# Patient Record
Sex: Female | Born: 1955
Health system: Southern US, Community
[De-identification: ages and names within clinical notes are randomized; demographics above are authoritative.]

---

## 2014-05-16 DIAGNOSIS — R0789 Other chest pain: Secondary | ICD-10-CM | POA: Diagnosis not present

## 2014-05-16 DIAGNOSIS — F17210 Nicotine dependence, cigarettes, uncomplicated: Secondary | ICD-10-CM | POA: Diagnosis not present

## 2014-05-16 DIAGNOSIS — J45909 Unspecified asthma, uncomplicated: Secondary | ICD-10-CM | POA: Diagnosis not present

## 2014-05-16 DIAGNOSIS — R0602 Shortness of breath: Secondary | ICD-10-CM | POA: Diagnosis not present

## 2014-05-16 DIAGNOSIS — I10 Essential (primary) hypertension: Secondary | ICD-10-CM | POA: Diagnosis not present

## 2014-05-16 DIAGNOSIS — J449 Chronic obstructive pulmonary disease, unspecified: Secondary | ICD-10-CM | POA: Diagnosis not present

## 2014-05-16 DIAGNOSIS — I2510 Atherosclerotic heart disease of native coronary artery without angina pectoris: Secondary | ICD-10-CM | POA: Diagnosis not present

## 2014-05-16 DIAGNOSIS — R069 Unspecified abnormalities of breathing: Secondary | ICD-10-CM | POA: Diagnosis not present

## 2014-05-16 DIAGNOSIS — E780 Pure hypercholesterolemia: Secondary | ICD-10-CM | POA: Diagnosis not present

## 2014-05-16 DIAGNOSIS — Z7982 Long term (current) use of aspirin: Secondary | ICD-10-CM | POA: Diagnosis not present

## 2014-05-16 DIAGNOSIS — J441 Chronic obstructive pulmonary disease with (acute) exacerbation: Secondary | ICD-10-CM | POA: Diagnosis not present

## 2014-05-16 DIAGNOSIS — E039 Hypothyroidism, unspecified: Secondary | ICD-10-CM | POA: Diagnosis not present

## 2014-05-20 DIAGNOSIS — J449 Chronic obstructive pulmonary disease, unspecified: Secondary | ICD-10-CM | POA: Diagnosis not present

## 2014-05-29 DIAGNOSIS — M25562 Pain in left knee: Secondary | ICD-10-CM | POA: Diagnosis not present

## 2014-06-12 DIAGNOSIS — M1712 Unilateral primary osteoarthritis, left knee: Secondary | ICD-10-CM | POA: Diagnosis not present

## 2014-06-20 DIAGNOSIS — J449 Chronic obstructive pulmonary disease, unspecified: Secondary | ICD-10-CM | POA: Diagnosis not present

## 2014-07-19 DIAGNOSIS — J449 Chronic obstructive pulmonary disease, unspecified: Secondary | ICD-10-CM | POA: Diagnosis not present

## 2014-07-25 DIAGNOSIS — B86 Scabies: Secondary | ICD-10-CM | POA: Diagnosis not present

## 2014-07-25 DIAGNOSIS — B372 Candidiasis of skin and nail: Secondary | ICD-10-CM | POA: Diagnosis not present

## 2014-08-19 DIAGNOSIS — J449 Chronic obstructive pulmonary disease, unspecified: Secondary | ICD-10-CM | POA: Diagnosis not present

## 2014-08-27 DIAGNOSIS — G4733 Obstructive sleep apnea (adult) (pediatric): Secondary | ICD-10-CM | POA: Diagnosis not present

## 2014-09-04 DIAGNOSIS — G4733 Obstructive sleep apnea (adult) (pediatric): Secondary | ICD-10-CM | POA: Diagnosis not present

## 2014-09-18 DIAGNOSIS — J449 Chronic obstructive pulmonary disease, unspecified: Secondary | ICD-10-CM | POA: Diagnosis not present

## 2014-10-19 DIAGNOSIS — J449 Chronic obstructive pulmonary disease, unspecified: Secondary | ICD-10-CM | POA: Diagnosis not present

## 2014-11-18 DIAGNOSIS — J449 Chronic obstructive pulmonary disease, unspecified: Secondary | ICD-10-CM | POA: Diagnosis not present

## 2014-12-16 DIAGNOSIS — R7309 Other abnormal glucose: Secondary | ICD-10-CM | POA: Diagnosis not present

## 2014-12-16 DIAGNOSIS — J449 Chronic obstructive pulmonary disease, unspecified: Secondary | ICD-10-CM | POA: Diagnosis not present

## 2014-12-16 DIAGNOSIS — E079 Disorder of thyroid, unspecified: Secondary | ICD-10-CM | POA: Diagnosis not present

## 2014-12-16 DIAGNOSIS — E780 Pure hypercholesterolemia: Secondary | ICD-10-CM | POA: Diagnosis not present

## 2014-12-16 DIAGNOSIS — I10 Essential (primary) hypertension: Secondary | ICD-10-CM | POA: Diagnosis not present

## 2014-12-19 DIAGNOSIS — J449 Chronic obstructive pulmonary disease, unspecified: Secondary | ICD-10-CM | POA: Diagnosis not present

## 2015-01-19 DIAGNOSIS — J449 Chronic obstructive pulmonary disease, unspecified: Secondary | ICD-10-CM | POA: Diagnosis not present

## 2015-02-18 DIAGNOSIS — J449 Chronic obstructive pulmonary disease, unspecified: Secondary | ICD-10-CM | POA: Diagnosis not present

## 2015-02-24 DIAGNOSIS — R0602 Shortness of breath: Secondary | ICD-10-CM | POA: Diagnosis not present

## 2015-02-24 DIAGNOSIS — Z9981 Dependence on supplemental oxygen: Secondary | ICD-10-CM | POA: Diagnosis not present

## 2015-02-24 DIAGNOSIS — K219 Gastro-esophageal reflux disease without esophagitis: Secondary | ICD-10-CM | POA: Diagnosis not present

## 2015-02-24 DIAGNOSIS — J441 Chronic obstructive pulmonary disease with (acute) exacerbation: Secondary | ICD-10-CM | POA: Diagnosis not present

## 2015-02-24 DIAGNOSIS — E7800 Pure hypercholesterolemia, unspecified: Secondary | ICD-10-CM | POA: Diagnosis not present

## 2015-02-24 DIAGNOSIS — I10 Essential (primary) hypertension: Secondary | ICD-10-CM | POA: Diagnosis not present

## 2015-02-24 DIAGNOSIS — I2510 Atherosclerotic heart disease of native coronary artery without angina pectoris: Secondary | ICD-10-CM | POA: Diagnosis not present

## 2015-02-24 DIAGNOSIS — R0600 Dyspnea, unspecified: Secondary | ICD-10-CM | POA: Diagnosis not present

## 2015-02-24 DIAGNOSIS — R079 Chest pain, unspecified: Secondary | ICD-10-CM | POA: Diagnosis not present

## 2015-02-24 DIAGNOSIS — F17200 Nicotine dependence, unspecified, uncomplicated: Secondary | ICD-10-CM | POA: Diagnosis not present

## 2015-02-24 DIAGNOSIS — Z79899 Other long term (current) drug therapy: Secondary | ICD-10-CM | POA: Diagnosis not present

## 2015-02-24 DIAGNOSIS — R072 Precordial pain: Secondary | ICD-10-CM | POA: Diagnosis not present

## 2015-02-24 DIAGNOSIS — E039 Hypothyroidism, unspecified: Secondary | ICD-10-CM | POA: Diagnosis not present

## 2015-02-25 DIAGNOSIS — R0600 Dyspnea, unspecified: Secondary | ICD-10-CM | POA: Diagnosis not present

## 2015-02-25 DIAGNOSIS — R079 Chest pain, unspecified: Secondary | ICD-10-CM | POA: Diagnosis not present

## 2015-03-21 DIAGNOSIS — J449 Chronic obstructive pulmonary disease, unspecified: Secondary | ICD-10-CM | POA: Diagnosis not present

## 2015-04-20 DIAGNOSIS — J449 Chronic obstructive pulmonary disease, unspecified: Secondary | ICD-10-CM | POA: Diagnosis not present

## 2015-05-21 DIAGNOSIS — J449 Chronic obstructive pulmonary disease, unspecified: Secondary | ICD-10-CM | POA: Diagnosis not present

## 2015-06-21 DIAGNOSIS — J449 Chronic obstructive pulmonary disease, unspecified: Secondary | ICD-10-CM | POA: Diagnosis not present

## 2015-07-19 DIAGNOSIS — J449 Chronic obstructive pulmonary disease, unspecified: Secondary | ICD-10-CM | POA: Diagnosis not present

## 2015-08-04 DIAGNOSIS — Z0000 Encounter for general adult medical examination without abnormal findings: Secondary | ICD-10-CM | POA: Diagnosis not present

## 2015-08-04 DIAGNOSIS — R1013 Epigastric pain: Secondary | ICD-10-CM | POA: Diagnosis not present

## 2015-08-04 DIAGNOSIS — Z23 Encounter for immunization: Secondary | ICD-10-CM | POA: Diagnosis not present

## 2015-08-04 DIAGNOSIS — G8929 Other chronic pain: Secondary | ICD-10-CM | POA: Diagnosis not present

## 2015-08-06 DIAGNOSIS — R1010 Upper abdominal pain, unspecified: Secondary | ICD-10-CM | POA: Diagnosis not present

## 2015-08-19 DIAGNOSIS — J449 Chronic obstructive pulmonary disease, unspecified: Secondary | ICD-10-CM | POA: Diagnosis not present

## 2015-09-18 DIAGNOSIS — J449 Chronic obstructive pulmonary disease, unspecified: Secondary | ICD-10-CM | POA: Diagnosis not present

## 2015-10-19 DIAGNOSIS — J310 Chronic rhinitis: Secondary | ICD-10-CM | POA: Diagnosis not present

## 2015-10-19 DIAGNOSIS — R5383 Other fatigue: Secondary | ICD-10-CM | POA: Diagnosis not present

## 2015-10-19 DIAGNOSIS — J449 Chronic obstructive pulmonary disease, unspecified: Secondary | ICD-10-CM | POA: Diagnosis not present

## 2015-10-19 DIAGNOSIS — G4733 Obstructive sleep apnea (adult) (pediatric): Secondary | ICD-10-CM | POA: Diagnosis not present

## 2015-10-21 DIAGNOSIS — G4733 Obstructive sleep apnea (adult) (pediatric): Secondary | ICD-10-CM | POA: Diagnosis not present

## 2015-12-07 DIAGNOSIS — R21 Rash and other nonspecific skin eruption: Secondary | ICD-10-CM | POA: Diagnosis not present

## 2016-04-18 DIAGNOSIS — J310 Chronic rhinitis: Secondary | ICD-10-CM | POA: Diagnosis not present

## 2016-04-18 DIAGNOSIS — R5383 Other fatigue: Secondary | ICD-10-CM | POA: Diagnosis not present

## 2016-04-18 DIAGNOSIS — J449 Chronic obstructive pulmonary disease, unspecified: Secondary | ICD-10-CM | POA: Diagnosis not present

## 2016-04-18 DIAGNOSIS — G4733 Obstructive sleep apnea (adult) (pediatric): Secondary | ICD-10-CM | POA: Diagnosis not present

## 2016-04-29 DIAGNOSIS — J329 Chronic sinusitis, unspecified: Secondary | ICD-10-CM | POA: Diagnosis not present

## 2016-04-29 DIAGNOSIS — R233 Spontaneous ecchymoses: Secondary | ICD-10-CM | POA: Diagnosis not present

## 2016-04-29 DIAGNOSIS — R109 Unspecified abdominal pain: Secondary | ICD-10-CM | POA: Diagnosis not present

## 2016-04-29 DIAGNOSIS — R110 Nausea: Secondary | ICD-10-CM | POA: Diagnosis not present

## 2016-05-05 DIAGNOSIS — N200 Calculus of kidney: Secondary | ICD-10-CM | POA: Diagnosis not present

## 2016-05-05 DIAGNOSIS — R1012 Left upper quadrant pain: Secondary | ICD-10-CM | POA: Diagnosis not present

## 2016-05-11 ENCOUNTER — Inpatient Hospital Stay (HOSPITAL_COMMUNITY)
Admission: EM | Admit: 2016-05-11 | Discharge: 2016-05-16 | DRG: 064 | Disposition: E | Payer: Medicare Other | Attending: Pulmonary Disease | Admitting: Pulmonary Disease

## 2016-05-11 ENCOUNTER — Inpatient Hospital Stay (HOSPITAL_COMMUNITY): Payer: Medicare Other

## 2016-05-11 ENCOUNTER — Emergency Department (HOSPITAL_COMMUNITY): Payer: Medicare Other

## 2016-05-11 DIAGNOSIS — R739 Hyperglycemia, unspecified: Secondary | ICD-10-CM | POA: Diagnosis not present

## 2016-05-11 DIAGNOSIS — Z9981 Dependence on supplemental oxygen: Secondary | ICD-10-CM | POA: Diagnosis not present

## 2016-05-11 DIAGNOSIS — J439 Emphysema, unspecified: Secondary | ICD-10-CM | POA: Diagnosis not present

## 2016-05-11 DIAGNOSIS — Z4682 Encounter for fitting and adjustment of non-vascular catheter: Secondary | ICD-10-CM | POA: Diagnosis not present

## 2016-05-11 DIAGNOSIS — R4701 Aphasia: Secondary | ICD-10-CM | POA: Diagnosis not present

## 2016-05-11 DIAGNOSIS — G8191 Hemiplegia, unspecified affecting right dominant side: Secondary | ICD-10-CM | POA: Diagnosis present

## 2016-05-11 DIAGNOSIS — R1310 Dysphagia, unspecified: Secondary | ICD-10-CM | POA: Diagnosis present

## 2016-05-11 DIAGNOSIS — Z515 Encounter for palliative care: Secondary | ICD-10-CM | POA: Diagnosis not present

## 2016-05-11 DIAGNOSIS — I10 Essential (primary) hypertension: Secondary | ICD-10-CM | POA: Diagnosis present

## 2016-05-11 DIAGNOSIS — W19XXXA Unspecified fall, initial encounter: Secondary | ICD-10-CM

## 2016-05-11 DIAGNOSIS — Z6838 Body mass index (BMI) 38.0-38.9, adult: Secondary | ICD-10-CM

## 2016-05-11 DIAGNOSIS — E669 Obesity, unspecified: Secondary | ICD-10-CM | POA: Diagnosis present

## 2016-05-11 DIAGNOSIS — R400 Somnolence: Secondary | ICD-10-CM | POA: Diagnosis not present

## 2016-05-11 DIAGNOSIS — R34 Anuria and oliguria: Secondary | ICD-10-CM | POA: Diagnosis not present

## 2016-05-11 DIAGNOSIS — Z66 Do not resuscitate: Secondary | ICD-10-CM | POA: Diagnosis not present

## 2016-05-11 DIAGNOSIS — G936 Cerebral edema: Secondary | ICD-10-CM | POA: Diagnosis not present

## 2016-05-11 DIAGNOSIS — J9600 Acute respiratory failure, unspecified whether with hypoxia or hypercapnia: Secondary | ICD-10-CM | POA: Diagnosis present

## 2016-05-11 DIAGNOSIS — Z7983 Long term (current) use of bisphosphonates: Secondary | ICD-10-CM

## 2016-05-11 DIAGNOSIS — R4020 Unspecified coma: Secondary | ICD-10-CM | POA: Diagnosis not present

## 2016-05-11 DIAGNOSIS — R29722 NIHSS score 22: Secondary | ICD-10-CM | POA: Diagnosis not present

## 2016-05-11 DIAGNOSIS — Z978 Presence of other specified devices: Secondary | ICD-10-CM

## 2016-05-11 DIAGNOSIS — R402421 Glasgow coma scale score 9-12, in the field [EMT or ambulance]: Secondary | ICD-10-CM | POA: Diagnosis not present

## 2016-05-11 DIAGNOSIS — E870 Hyperosmolality and hypernatremia: Secondary | ICD-10-CM | POA: Diagnosis present

## 2016-05-11 DIAGNOSIS — J449 Chronic obstructive pulmonary disease, unspecified: Secondary | ICD-10-CM | POA: Diagnosis present

## 2016-05-11 DIAGNOSIS — Z79899 Other long term (current) drug therapy: Secondary | ICD-10-CM

## 2016-05-11 DIAGNOSIS — I6789 Other cerebrovascular disease: Secondary | ICD-10-CM | POA: Diagnosis not present

## 2016-05-11 DIAGNOSIS — J9601 Acute respiratory failure with hypoxia: Secondary | ICD-10-CM | POA: Diagnosis not present

## 2016-05-11 DIAGNOSIS — Z9289 Personal history of other medical treatment: Secondary | ICD-10-CM

## 2016-05-11 DIAGNOSIS — I63412 Cerebral infarction due to embolism of left middle cerebral artery: Principal | ICD-10-CM | POA: Diagnosis present

## 2016-05-11 DIAGNOSIS — I639 Cerebral infarction, unspecified: Secondary | ICD-10-CM | POA: Diagnosis not present

## 2016-05-11 DIAGNOSIS — Z452 Encounter for adjustment and management of vascular access device: Secondary | ICD-10-CM

## 2016-05-11 DIAGNOSIS — E785 Hyperlipidemia, unspecified: Secondary | ICD-10-CM | POA: Diagnosis present

## 2016-05-11 DIAGNOSIS — J9811 Atelectasis: Secondary | ICD-10-CM | POA: Diagnosis not present

## 2016-05-11 DIAGNOSIS — Z4659 Encounter for fitting and adjustment of other gastrointestinal appliance and device: Secondary | ICD-10-CM

## 2016-05-11 DIAGNOSIS — S40011A Contusion of right shoulder, initial encounter: Secondary | ICD-10-CM | POA: Diagnosis not present

## 2016-05-11 DIAGNOSIS — G9340 Encephalopathy, unspecified: Secondary | ICD-10-CM | POA: Diagnosis not present

## 2016-05-11 DIAGNOSIS — R4182 Altered mental status, unspecified: Secondary | ICD-10-CM | POA: Diagnosis present

## 2016-05-11 DIAGNOSIS — I161 Hypertensive emergency: Secondary | ICD-10-CM | POA: Diagnosis not present

## 2016-05-11 DIAGNOSIS — I63512 Cerebral infarction due to unspecified occlusion or stenosis of left middle cerebral artery: Secondary | ICD-10-CM

## 2016-05-11 LAB — I-STAT TROPONIN, ED: TROPONIN I, POC: 0 ng/mL (ref 0.00–0.08)

## 2016-05-11 LAB — I-STAT CHEM 8, ED
BUN: 15 mg/dL (ref 6–20)
Calcium, Ion: 1.05 mmol/L — ABNORMAL LOW (ref 1.15–1.40)
Chloride: 101 mmol/L (ref 101–111)
Creatinine, Ser: 0.5 mg/dL (ref 0.44–1.00)
Glucose, Bld: 160 mg/dL — ABNORMAL HIGH (ref 65–99)
HEMATOCRIT: 48 % — AB (ref 36.0–46.0)
HEMOGLOBIN: 16.3 g/dL — AB (ref 12.0–15.0)
POTASSIUM: 4 mmol/L (ref 3.5–5.1)
SODIUM: 140 mmol/L (ref 135–145)
TCO2: 27 mmol/L (ref 0–100)

## 2016-05-11 LAB — I-STAT CG4 LACTIC ACID, ED: Lactic Acid, Venous: 3.16 mmol/L (ref 0.5–1.9)

## 2016-05-11 LAB — URINALYSIS, ROUTINE W REFLEX MICROSCOPIC
BACTERIA UA: NONE SEEN
Bilirubin Urine: NEGATIVE
Glucose, UA: >=500 mg/dL — AB
Hgb urine dipstick: NEGATIVE
Ketones, ur: NEGATIVE mg/dL
Leukocytes, UA: NEGATIVE
Nitrite: NEGATIVE
PROTEIN: NEGATIVE mg/dL
SPECIFIC GRAVITY, URINE: 1.021 (ref 1.005–1.030)
pH: 6 (ref 5.0–8.0)

## 2016-05-11 LAB — COMPREHENSIVE METABOLIC PANEL
ALBUMIN: 3.5 g/dL (ref 3.5–5.0)
ALK PHOS: 67 U/L (ref 38–126)
ALT: 28 U/L (ref 14–54)
ANION GAP: 13 (ref 5–15)
AST: 38 U/L (ref 15–41)
BILIRUBIN TOTAL: 0.6 mg/dL (ref 0.3–1.2)
BUN: 13 mg/dL (ref 6–20)
CO2: 24 mmol/L (ref 22–32)
Calcium: 9 mg/dL (ref 8.9–10.3)
Chloride: 101 mmol/L (ref 101–111)
Creatinine, Ser: 0.63 mg/dL (ref 0.44–1.00)
GLUCOSE: 151 mg/dL — AB (ref 65–99)
POTASSIUM: 4 mmol/L (ref 3.5–5.1)
Sodium: 138 mmol/L (ref 135–145)
TOTAL PROTEIN: 7.2 g/dL (ref 6.5–8.1)

## 2016-05-11 LAB — CBC WITH DIFFERENTIAL/PLATELET
BASOS PCT: 0 %
Basophils Absolute: 0 10*3/uL (ref 0.0–0.1)
Eosinophils Absolute: 0 10*3/uL (ref 0.0–0.7)
Eosinophils Relative: 0 %
HEMATOCRIT: 45.2 % (ref 36.0–46.0)
HEMOGLOBIN: 15.4 g/dL — AB (ref 12.0–15.0)
LYMPHS PCT: 11 %
Lymphs Abs: 1.4 10*3/uL (ref 0.7–4.0)
MCH: 31.3 pg (ref 26.0–34.0)
MCHC: 34.1 g/dL (ref 30.0–36.0)
MCV: 91.9 fL (ref 78.0–100.0)
MONO ABS: 0.9 10*3/uL (ref 0.1–1.0)
MONOS PCT: 7 %
NEUTROS ABS: 10.9 10*3/uL — AB (ref 1.7–7.7)
NEUTROS PCT: 82 %
Platelets: 249 10*3/uL (ref 150–400)
RBC: 4.92 MIL/uL (ref 3.87–5.11)
RDW: 12.3 % (ref 11.5–15.5)
WBC: 13.1 10*3/uL — ABNORMAL HIGH (ref 4.0–10.5)

## 2016-05-11 LAB — PROTIME-INR
INR: 1.03
PROTHROMBIN TIME: 13.5 s (ref 11.4–15.2)

## 2016-05-11 LAB — PHOSPHORUS: Phosphorus: 3.9 mg/dL (ref 2.5–4.6)

## 2016-05-11 LAB — SODIUM: SODIUM: 139 mmol/L (ref 135–145)

## 2016-05-11 LAB — I-STAT ARTERIAL BLOOD GAS, ED
ACID-BASE EXCESS: 4 mmol/L — AB (ref 0.0–2.0)
BICARBONATE: 31.6 mmol/L — AB (ref 20.0–28.0)
O2 Saturation: 100 %
PH ART: 7.327 — AB (ref 7.350–7.450)
PO2 ART: 479 mmHg — AB (ref 83.0–108.0)
TCO2: 33 mmol/L (ref 0–100)
pCO2 arterial: 60.3 mmHg — ABNORMAL HIGH (ref 32.0–48.0)

## 2016-05-11 LAB — TRIGLYCERIDES
Triglycerides: 106 mg/dL (ref <150)
Triglycerides: 149 mg/dL (ref <150)

## 2016-05-11 LAB — ACETAMINOPHEN LEVEL

## 2016-05-11 LAB — SALICYLATE LEVEL

## 2016-05-11 LAB — MAGNESIUM: MAGNESIUM: 1.9 mg/dL (ref 1.7–2.4)

## 2016-05-11 LAB — CK: Total CK: 198 U/L (ref 38–234)

## 2016-05-11 LAB — LACTIC ACID, PLASMA: LACTIC ACID, VENOUS: 2.7 mmol/L — AB (ref 0.5–1.9)

## 2016-05-11 LAB — ETHANOL: Alcohol, Ethyl (B): <5 mg/dL (ref <5)

## 2016-05-11 MED ORDER — ROCURONIUM BROMIDE 50 MG/5ML IV SOLN
90.0000 mg | Freq: Once | INTRAVENOUS | Status: AC
  Filled 2016-05-11: qty 9

## 2016-05-11 MED ORDER — FAMOTIDINE IN NACL 20-0.9 MG/50ML-% IV SOLN
20.0000 mg | Freq: Two times a day (BID) | INTRAVENOUS | Status: DC
  Administered 2016-05-11 – 2016-05-12 (×3): 20 mg via INTRAVENOUS
  Filled 2016-05-11 (×3): qty 50

## 2016-05-11 MED ORDER — ALBUTEROL SULFATE (2.5 MG/3ML) 0.083% IN NEBU: 2.5000 mg | INHALATION_SOLUTION | RESPIRATORY_TRACT | Status: DC | PRN

## 2016-05-11 MED ORDER — FENTANYL CITRATE (PF) 100 MCG/2ML IJ SOLN
50.0000 ug | Freq: Once | INTRAMUSCULAR | Status: AC
  Administered 2016-05-11: 50 ug via INTRAVENOUS
  Filled 2016-05-11: qty 2

## 2016-05-11 MED ORDER — ETOMIDATE 2 MG/ML IV SOLN: 30.0000 mg | Freq: Once | INTRAVENOUS | Status: AC

## 2016-05-11 MED ORDER — SODIUM CHLORIDE 0.9 % IV SOLN
25.0000 ug/h | INTRAVENOUS | Status: DC
  Administered 2016-05-11: 50 ug/h via INTRAVENOUS
  Filled 2016-05-11: qty 50

## 2016-05-11 MED ORDER — IPRATROPIUM-ALBUTEROL 0.5-2.5 (3) MG/3ML IN SOLN: 3.0000 mL | Freq: Four times a day (QID) | RESPIRATORY_TRACT | Status: DC

## 2016-05-11 MED ORDER — LEVOTHYROXINE SODIUM 100 MCG IV SOLR
37.5000 ug | Freq: Every day | INTRAVENOUS | Status: DC
  Administered 2016-05-12: 37.5 ug via INTRAVENOUS
  Filled 2016-05-11: qty 5

## 2016-05-11 MED ORDER — CHLORHEXIDINE GLUCONATE 0.12% ORAL RINSE (MEDLINE KIT)
15.0000 mL | Freq: Two times a day (BID) | OROMUCOSAL | Status: DC
  Administered 2016-05-11 – 2016-05-13 (×4): 15 mL via OROMUCOSAL

## 2016-05-11 MED ORDER — ROCURONIUM BROMIDE 50 MG/5ML IV SOLN
INTRAVENOUS | Status: AC | PRN
  Administered 2016-05-11: 90 mg via INTRAVENOUS

## 2016-05-11 MED ORDER — PROPOFOL 1000 MG/100ML IV EMUL
0.0000 ug/kg/min | INTRAVENOUS | Status: DC
  Administered 2016-05-11: 5 ug/kg/min via INTRAVENOUS
  Filled 2016-05-11: qty 100

## 2016-05-11 MED ORDER — ORAL CARE MOUTH RINSE
15.0000 mL | OROMUCOSAL | Status: DC
  Administered 2016-05-12 – 2016-05-13 (×14): 15 mL via OROMUCOSAL

## 2016-05-11 MED ORDER — SODIUM CHLORIDE 0.9 % IV SOLN
250.0000 mL | INTRAVENOUS | Status: DC | PRN
  Administered 2016-05-11: 250 mL via INTRAVENOUS

## 2016-05-11 MED ORDER — PROPOFOL 1000 MG/100ML IV EMUL
5.0000 ug/kg/min | INTRAVENOUS | Status: DC
  Administered 2016-05-11 – 2016-05-12 (×2): 50 ug/kg/min via INTRAVENOUS
  Administered 2016-05-12: 15 ug/kg/min via INTRAVENOUS
  Administered 2016-05-12: 50 ug/kg/min via INTRAVENOUS
  Filled 2016-05-11 (×6): qty 100

## 2016-05-11 MED ORDER — FENTANYL CITRATE (PF) 100 MCG/2ML IJ SOLN: 50.0000 ug | Freq: Once | INTRAMUSCULAR | Status: DC

## 2016-05-11 MED ORDER — FENTANYL BOLUS VIA INFUSION
50.0000 ug | INTRAVENOUS | Status: DC | PRN
Start: 2016-05-11 — End: 2016-05-11
  Administered 2016-05-11: 100 ug via INTRAVENOUS
  Filled 2016-05-11: qty 50

## 2016-05-11 MED ORDER — FENTANYL CITRATE (PF) 2500 MCG/50ML IJ SOLN
25.0000 ug/h | INTRAMUSCULAR | Status: DC
  Administered 2016-05-12: 200 ug/h via INTRAVENOUS
  Administered 2016-05-13: 100 ug/h via INTRAVENOUS
  Filled 2016-05-11 (×2): qty 50

## 2016-05-11 MED ORDER — ETOMIDATE 2 MG/ML IV SOLN
INTRAVENOUS | Status: AC | PRN
Start: 2016-05-11 — End: 2016-05-11
  Administered 2016-05-11: 30 mg via INTRAVENOUS

## 2016-05-11 MED ORDER — SODIUM CHLORIDE 3 % IV SOLN
INTRAVENOUS | Status: DC
  Administered 2016-05-11: 50 mL/h via INTRAVENOUS
  Administered 2016-05-12 – 2016-05-13 (×2): 70 mL/h via INTRAVENOUS
  Filled 2016-05-11 (×7): qty 500

## 2016-05-11 MED ORDER — SODIUM CHLORIDE 0.9 % IV BOLUS (SEPSIS)
1000.0000 mL | Freq: Once | INTRAVENOUS | Status: AC
  Administered 2016-05-11: 1000 mL via INTRAVENOUS

## 2016-05-11 MED ORDER — HYDRALAZINE HCL 20 MG/ML IJ SOLN: 10.0000 mg | INTRAMUSCULAR | Status: DC | PRN

## 2016-05-11 MED ORDER — IPRATROPIUM-ALBUTEROL 0.5-2.5 (3) MG/3ML IN SOLN: 3.0000 mL | Freq: Once | RESPIRATORY_TRACT | Status: AC

## 2016-05-11 MED ORDER — FENTANYL BOLUS VIA INFUSION
50.0000 ug | INTRAVENOUS | Status: DC | PRN
  Filled 2016-05-11: qty 50

## 2016-05-11 MED ORDER — INSULIN ASPART 100 UNIT/ML ~~LOC~~ SOLN
2.0000 [IU] | SUBCUTANEOUS | Status: DC
  Administered 2016-05-12 (×2): 4 [IU] via SUBCUTANEOUS
  Administered 2016-05-12: 2 [IU] via SUBCUTANEOUS
  Administered 2016-05-13: 4 [IU] via SUBCUTANEOUS
  Administered 2016-05-13: 2 [IU] via SUBCUTANEOUS
  Administered 2016-05-13: 4 [IU] via SUBCUTANEOUS

## 2016-05-11 NOTE — ED Triage Notes (Signed)
Patient found laying on the floor of her bathroom unresponsive. Transported via EMS. Patient's daughter reports last seeing her mid-day yesterday. Patient is not able to speak and only has uncoordinated movements. BP 160/80, HR 86, SpO2 95 on RA.

## 2016-05-11 NOTE — Procedures (Signed)
Central Venous Catheter Insertion Procedure Note Kim FlorasBernice J Mays [id number] 1956/04/18  Procedure: Insertion of Central Venous Catheter Indications: Assessment of intravascular volume, Drug and/or fluid administration and Frequent blood sampling  Procedure Details Consent: Risks of procedure as well as the alternatives and risks of each were explained to the (patient/caregiver).  Consent for procedure obtained. Time Out: Verified patient identification, verified procedure, site/side was marked, verified correct patient position, special equipment/implants available, medications/allergies/relevent history reviewed, required imaging and test results available.  Performed  Maximum sterile technique was used including antiseptics, cap, gloves, gown, hand hygiene, mask and sheet. Skin prep: Chlorhexidine; local anesthetic administered A antimicrobial bonded/coated triple lumen catheter was placed in the right internal jugular vein using the Seldinger technique.  Evaluation Blood flow good Complications: No apparent complications Patient did tolerate procedure well. Chest X-ray ordered to verify placement.  CXR: pending.  Procedure performed under direct ultrasound guidance for real time vessel cannulation.      Rutherford Guysahul Madonna Flegal, [place] - [facility] Pager: [phone]  or [phone] 04/19/2016, 11:12 PM

## 2016-05-11 NOTE — Consult Note (Signed)
Admission H&P    Chief Complaint: Acute left MCA stroke.  HPI: Kim Mays is an 60 y.o. female with a history of hypertension, hyperlipidemia and COPD who was found on the floor of her home this afternoon speech less and unable to move her right side. She was last known well last night when she talked with a family member on the phone. She has no previous history of stroke nor TIA. CT scan of her head showed a large left MCA territory acute infarction, as well as hyperdense left MCA sign, likely acute M1 thrombus. Patient was intubated in the ED for airway protection and subsequently laced on propofol drip. NIH score was difficult to assess because she was completely sedated.  LSN: Evening of 05/10/2016 tPA Given: No: Beyond time under for treatment consideration; large completed infarct mRankin:  Past medical history: Hypertension, hyperlipidemia, COPD  No past surgical history on file.  Family history: Reviewed and noncontributory.  Social History:  has no tobacco, alcohol, and drug history on file.  Allergies: No Known Allergies  Medications: Admission medications were reviewed by me.  ROS: Unobtainable due to patient's unresponsive state.  Physical Examination: Blood pressure (!) 157/108, pulse 81, temperature 98.6 F (37 C), temperature source Oral, resp. rate 19, height 5' 4" (1.626 m), weight 90 kg (198 lb 6.6 oz), SpO2 98 %.  HEENT-  Normocephalic, no lesions, without obvious abnormality.  Normal external eye and conjunctiva.  Normal TM's bilaterally.  Normal auditory canals and external ears. Normal external nose, mucus membranes and septum.  Normal pharynx. Neck supple with no masses, nodes, nodules or enlargement. Cardiovascular - regular rate and rhythm, S1, S2 normal, no murmur, click, rub or gallop Lungs - chest clear, no wheezing, rales, normal symmetric air entry Abdomen - soft, non-tender; bowel sounds normal; no masses,  no organomegaly Extremities - no  joint deformities, effusion, or inflammation  Neurologic Examination: Patient was intubated and on mechanical ventilation. No spontaneous respirations were noted. She was sedated with IV propofol and was unresponsive to external stimuli, including noxious stimuli. Pupils were equal and nonreactive to light. Extraocular movements were absent with oculocephalic maneuvers. Face was symmetrical with no indication of focal weakness. Muscle tone was flaccid throughout. There was no abnormal posturing and patient had no spontaneous movements of extremities. Deep tendon reflexes were trace to 1+ and symmetrical. Plantar responses were mute.  Results for orders placed or performed during the hospital encounter of 05/15/2016 (from the past 48 hour(s))  CBC with Differential     Status: Abnormal   Collection Time: 04/15/2016  6:17 PM  Result Value Ref Range   WBC 13.1 (H) 4.0 - 10.5 K/uL   RBC 4.92 3.87 - 5.11 MIL/uL   Hemoglobin 15.4 (H) 12.0 - 15.0 g/dL   HCT 45.2 36.0 - 46.0 %   MCV 91.9 78.0 - 100.0 fL   MCH 31.3 26.0 - 34.0 pg   MCHC 34.1 30.0 - 36.0 g/dL   RDW 12.3 11.5 - 15.5 %   Platelets 249 150 - 400 K/uL   Neutrophils Relative % 82 %   Neutro Abs 10.9 (H) 1.7 - 7.7 K/uL   Lymphocytes Relative 11 %   Lymphs Abs 1.4 0.7 - 4.0 K/uL   Monocytes Relative 7 %   Monocytes Absolute 0.9 0.1 - 1.0 K/uL   Eosinophils Relative 0 %   Eosinophils Absolute 0.0 0.0 - 0.7 K/uL   Basophils Relative 0 %   Basophils Absolute 0.0 0.0 - 0.1 K/uL  Comprehensive metabolic panel     Status: Abnormal   Collection Time: 04/16/2016  6:17 PM  Result Value Ref Range   Sodium 138 135 - 145 mmol/L   Potassium 4.0 3.5 - 5.1 mmol/L   Chloride 101 101 - 111 mmol/L   CO2 24 22 - 32 mmol/L   Glucose, Bld 151 (H) 65 - 99 mg/dL   BUN 13 6 - 20 mg/dL   Creatinine, Ser 0.63 0.44 - 1.00 mg/dL   Calcium 9.0 8.9 - 10.3 mg/dL   Total Protein 7.2 6.5 - 8.1 g/dL   Albumin 3.5 3.5 - 5.0 g/dL   AST 38 15 - 41 U/L   ALT 28  14 - 54 U/L   Alkaline Phosphatase 67 38 - 126 U/L   Total Bilirubin 0.6 0.3 - 1.2 mg/dL   GFR calc non Af Amer >60 >60 mL/min   GFR calc Af Amer >60 >60 mL/min    Comment: (NOTE) The eGFR has been calculated using the CKD EPI equation. This calculation has not been validated in all clinical situations. eGFR's persistently <60 mL/min signify possible Chronic Kidney Disease.    Anion gap 13 5 - 15  CK     Status: None   Collection Time: 04/15/2016  6:17 PM  Result Value Ref Range   Total CK 198 38 - 234 U/L  Protime-INR     Status: None   Collection Time: 05/06/2016  6:17 PM  Result Value Ref Range   Prothrombin Time 13.5 11.4 - 15.2 seconds   INR 1.03   Acetaminophen level     Status: Abnormal   Collection Time: 04/15/2016  6:17 PM  Result Value Ref Range   Acetaminophen (Tylenol), Serum <10 (L) 10 - 30 ug/mL    Comment:        THERAPEUTIC CONCENTRATIONS VARY SIGNIFICANTLY. A RANGE OF 10-30 ug/mL MAY BE AN EFFECTIVE CONCENTRATION FOR MANY PATIENTS. HOWEVER, SOME ARE BEST TREATED AT CONCENTRATIONS OUTSIDE THIS RANGE. ACETAMINOPHEN CONCENTRATIONS >150 ug/mL AT 4 HOURS AFTER INGESTION AND >50 ug/mL AT 12 HOURS AFTER INGESTION ARE OFTEN ASSOCIATED WITH TOXIC REACTIONS.   Salicylate level     Status: None   Collection Time: 04/23/2016  6:17 PM  Result Value Ref Range   Salicylate Lvl <2.1 2.8 - 30.0 mg/dL  Ethanol     Status: None   Collection Time: 04/30/2016  6:17 PM  Result Value Ref Range   Alcohol, Ethyl (B) <5 <5 mg/dL    Comment:        LOWEST DETECTABLE LIMIT FOR SERUM ALCOHOL IS 5 mg/dL FOR MEDICAL PURPOSES ONLY   Triglycerides     Status: None   Collection Time: 04/20/2016  6:17 PM  Result Value Ref Range   Triglycerides 106 <150 mg/dL  I-Stat Troponin, ED (not at [facility])     Status: None   Collection Time: 04/27/2016  6:34 PM  Result Value Ref Range   Troponin i, poc 0.00 0.00 - 0.08 ng/mL   Comment 3            Comment: Due to the release kinetics of cTnI, a  negative result within the first hours of the onset of symptoms does not rule out myocardial infarction with certainty. If myocardial infarction is still suspected, repeat the test at appropriate intervals.   I-Stat Chem 8, ED     Status: Abnormal   Collection Time: 04/17/2016  6:35 PM  Result Value Ref Range   Sodium 140 135 - 145 mmol/L  Potassium 4.0 3.5 - 5.1 mmol/L   Chloride 101 101 - 111 mmol/L   BUN 15 6 - 20 mg/dL   Creatinine, Ser 0.50 0.44 - 1.00 mg/dL   Glucose, Bld 160 (H) 65 - 99 mg/dL   Calcium, Ion 1.05 (L) 1.15 - 1.40 mmol/L   TCO2 27 0 - 100 mmol/L   Hemoglobin 16.3 (H) 12.0 - 15.0 g/dL   HCT 48.0 (H) 36.0 - 46.0 %  I-Stat CG4 Lactic Acid, ED     Status: Abnormal   Collection Time: 04/19/2016  6:36 PM  Result Value Ref Range   Lactic Acid, Venous 3.16 (HH) 0.5 - 1.9 mmol/L   Comment NOTIFIED PHYSICIAN   I-Stat Arterial Blood Gas, ED - (order at [facility] and [facility] only)     Status: Abnormal   Collection Time: 05/06/2016  6:58 PM  Result Value Ref Range   pH, Arterial 7.327 (L) 7.350 - 7.450   pCO2 arterial 60.3 (H) 32.0 - 48.0 mmHg   pO2, Arterial 479.0 (H) 83.0 - 108.0 mmHg   Bicarbonate 31.6 (H) 20.0 - 28.0 mmol/L   TCO2 33 0 - 100 mmol/L   O2 Saturation 100.0 %   Acid-Base Excess 4.0 (H) 0.0 - 2.0 mmol/L   Patient temperature HIDE    Sample type ARTERIAL    Ct Head Wo Contrast  Result Date: 05/08/2016 CLINICAL DATA:  Altered mental status. Found unresponsive on bathroom floor. EXAM: CT HEAD WITHOUT CONTRAST TECHNIQUE: Contiguous axial images were obtained from the base of the skull through the vertex without intravenous contrast. COMPARISON:  CT HEAD March 10, 2010 FINDINGS: BRAIN: Blurring of the LEFT frontotemporal parietal gray-white matter with sulcal effacement. Patchy hypodensity LEFT basal ganglia. Faint focal density LEFT frontal subcortical white matter. Partially effaced LEFT lateral ventricle with 3 mm LEFT-to-RIGHT midline shift. No abnormal extra-axial  fluid collections. Basal cisterns are patent. VASCULAR: Dense LEFT M1 segment. Mild calcific atherosclerosis the carotid siphon. SKULL/SOFT TISSUES: No skull fracture. No significant soft tissue swelling. Included LEFT maxillary periapical lucency/abscess. ORBITS/SINUSES: The included ocular globes and orbital contents are normal.Minimal LEFT mastoid effusion. Paranasal sinuses are well-aerated. OTHER: None. 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 Faint LEFT frontal lobe petechial hemorrhage without lobar hematoma. 3 mm LEFT to RIGHT midline shift, no ventricular entrapment. Critical Value/emergent results were called by telephone at the time of interpretation on 05/08/2016 at 6:10 pm to Dr. Duffy Bruce , who verbally acknowledged these results. Electronically Signed   By: Elon Alas M.D.   On: 04/29/2016 18:16   Dg Chest Portable 1 View  Result Date: 05/15/2016 CLINICAL DATA:  Altered mental status, post intubation. EXAM: PORTABLE CHEST 1 VIEW COMPARISON:  05/16/2014 FINDINGS: Endotracheal tube is 5 cm above the carina. Heart is normal size. Minimal densities in the bases, likely atelectasis. No effusions or acute bony abnormality. IMPRESSION: Endotracheal tube 5 cm above the carina. Bibasilar atelectasis. Electronically Signed   By: Rolm Baptise M.D.   On: 04/24/2016 18:41    Assessment: 60 y.o. female with multiple risk factors for stroke presenting with a large MCA territory acute ischemic infarction with thrombosis of M1 segment of left MCA, and signs of early cerebral edema with mass effect.  Stroke Risk Factors - hyperlipidemia and hypertension  Plan: 1. Hypertonic (3%)  IV saline for management of impending cerebral edema and mass effect of large left MCA stroke 2. MRI, MRA  of the brain without contrast 3. PT consult, OT  consult, Speech consult 4. Echocardiogram 5. Carotid dopplers 6. Prophylactic therapy-Antiplatelet  med: Aspirin  7. Risk factor modification 8. HgbA1c, fasting lipid panel  C.R. Nicole Kindred, MD [facility] [phone]  05/14/2016, 7:58 PM

## 2016-05-11 NOTE — ED Notes (Signed)
Pt suctioned by RT.

## 2016-05-11 NOTE — ED Provider Notes (Signed)
[facility] DEPT Provider Note   CSN: [id number] Arrival date & time: 05/08/2016  1739     History   Chief Complaint Chief Complaint  Patient presents with  . Altered Mental Status    HPI Kim Mays is a 60 y.o. female.  HPI  60 yo F with no known PMHx here with AMS. Per report from EMS, pt found down, in her bathroom, minimally responsive. Last known normal was >24 hours ago. She has COPD and is on home O2 and reportedly is non-adherent with it. She last saw her daughter last night and was at baseline according to EMS report. Daughter was gone overnight then returned to find her altered. Remainder of history limited 2/2 AMS.  Level 5 caveat invoked as remainder of history, ROS, and physical exam limited due to patient's AMS.   No past medical history on file.  Patient Active Problem List   Diagnosis Date Noted  . 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 Encounter for central line placement     No past surgical history on file.  OB History    No data available       Home Medications    Prior to Admission medications   Medication Sig Start Date End Date Taking? Authorizing Provider  albuterol (PROVENTIL) (2.5 MG/3ML) 0.083% nebulizer solution Inhale 3 mLs into the lungs every 6 (six) hours as needed for shortness of breath. 03/13/16  Yes Historical Provider, MD  amLODipine (NORVASC) 5 MG tablet Take 5 mg by mouth daily. 03/19/16  Yes Historical Provider, MD  atenolol (TENORMIN) 25 MG tablet Take 50 mg by mouth daily.  04/11/16  Yes Historical Provider, MD  ibandronate (BONIVA) 150 MG tablet Take 150 mg by mouth every 30 (thirty) days. 03/19/16  Yes Historical Provider, MD  levothyroxine (SYNTHROID, LEVOTHROID) 75 MCG tablet Take 75 mcg by mouth daily before breakfast. 03/13/16  Yes Historical Provider, MD  ondansetron (ZOFRAN) 4 MG tablet Take 4 mg by mouth every 6 (six) hours as needed for nausea/vomiting. 04/29/16  Yes  Historical Provider, MD  SPIRIVA HANDIHALER 18 MCG inhalation capsule Place 1 application into inhaler and inhale daily. 03/19/16  Yes Historical Provider, MD  ZETIA 10 MG tablet Take 10 mg by mouth daily. 04/14/16  Yes Historical Provider, MD    Family History No family history on file.  Social History Social History  Substance Use Topics  . Smoking status: Not on file  . Smokeless tobacco: Not on file  . Alcohol use Not on file     Allergies   Patient has no known allergies.   Review of Systems Review of Systems  Unable to perform ROS: Mental status change     Physical Exam Updated Vital Signs BP 113/75   Pulse 65   Temp 98.5 F (36.9 C) (Oral)   Resp (!) 26   Ht 5' 4"  (1.626 m)   Wt 198 lb 6.6 oz (90 kg)   LMP  (LMP Unknown)   SpO2 100%   BMI 34.06 kg/m   Physical Exam  Constitutional: She appears distressed.  Ill appearing, appears older than stated age.  HENT:  Head: Normocephalic.  Eyes: Conjunctivae are normal.  Neck: Neck supple.  Cardiovascular: Normal rate, regular rhythm and normal heart sounds.  Exam reveals no friction rub.   No murmur heard. Pulmonary/Chest: She is in respiratory distress. She has no wheezes. She has no rales.  Intermittent tachypnea with transmitted upper airway sounds. Coarse  breath sounds bilaterally.  Abdominal: Soft. She exhibits no distension.  Musculoskeletal: She exhibits no edema.  Neurological:  Disoriented, distressed. Face appears grossly symmetric. Oculocephalic reflexes intact. Pt noted to have left-sided gaze preference. No spontaneous movement of RUE and RLE. LUE and LLE antri gravity.  Skin: Skin is warm. Capillary refill takes less than 2 seconds.  Psychiatric: She has a normal mood and affect.  Nursing note and vitals reviewed.    ED Treatments / Results  Labs (all labs ordered are listed, but only abnormal results are displayed) Labs Reviewed  CBC WITH DIFFERENTIAL/PLATELET - Abnormal; Notable for the  following:       Result Value   WBC 13.1 (*)    Hemoglobin 15.4 (*)    Neutro Abs 10.9 (*)    All other components within normal limits  COMPREHENSIVE METABOLIC PANEL - Abnormal; Notable for the following:    Glucose, Bld 151 (*)    All other components within normal limits  URINALYSIS, ROUTINE W REFLEX MICROSCOPIC - Abnormal; Notable for the following:    Glucose, UA >=500 (*)    Squamous Epithelial / LPF 0-5 (*)    All other components within normal limits  ACETAMINOPHEN LEVEL - Abnormal; Notable for the following:    Acetaminophen (Tylenol), Serum <10 (*)    All other components within normal limits  LACTIC ACID, PLASMA - Abnormal; Notable for the following:    Lactic Acid, Venous 2.7 (*)    All other components within normal limits  GLUCOSE, CAPILLARY - Abnormal; Notable for the following:    Glucose-Capillary 122 (*)    All other components within normal limits  I-STAT ARTERIAL BLOOD GAS, ED - Abnormal; Notable for the following:    pH, Arterial 7.327 (*)    pCO2 arterial 60.3 (*)    pO2, Arterial 479.0 (*)    Bicarbonate 31.6 (*)    Acid-Base Excess 4.0 (*)    All other components within normal limits  I-STAT CHEM 8, ED - Abnormal; Notable for the following:    Glucose, Bld 160 (*)    Calcium, Ion 1.05 (*)    Hemoglobin 16.3 (*)    HCT 48.0 (*)    All other components within normal limits  I-STAT CG4 LACTIC ACID, ED - Abnormal; Notable for the following:    Lactic Acid, Venous 3.16 (*)    All other components within normal limits  MRSA PCR SCREENING  CK  PROTIME-INR  SALICYLATE LEVEL  RAPID URINE DRUG SCREEN, HOSP PERFORMED  ETHANOL  TRIGLYCERIDES  TRIGLYCERIDES  MAGNESIUM  PHOSPHORUS  SODIUM  CBC  BASIC METABOLIC PANEL  MAGNESIUM  PHOSPHORUS  SODIUM  SODIUM  SODIUM  I-STAT TROPOININ, ED    EKG  EKG Interpretation None       Radiology Ct Head Wo Contrast  Result Date: 04/20/2016 CLINICAL DATA:  Altered mental status. Found unresponsive on  bathroom floor. EXAM: CT HEAD WITHOUT CONTRAST TECHNIQUE: Contiguous axial images were obtained from the base of the skull through the vertex without intravenous contrast. COMPARISON:  CT HEAD March 10, 2010 FINDINGS: BRAIN: Blurring of the LEFT frontotemporal parietal gray-white matter with sulcal effacement. Patchy hypodensity LEFT basal ganglia. Faint focal density LEFT frontal subcortical white matter. Partially effaced LEFT lateral ventricle with 3 mm LEFT-to-RIGHT midline shift. No abnormal extra-axial fluid collections. Basal cisterns are patent. VASCULAR: Dense LEFT M1 segment. Mild calcific atherosclerosis the carotid siphon. SKULL/SOFT TISSUES: No skull fracture. No significant soft tissue swelling. Included LEFT maxillary periapical lucency/abscess. ORBITS/SINUSES:  The included ocular globes and orbital contents are normal.Minimal LEFT mastoid effusion. Paranasal sinuses are well-aerated. OTHER: None. 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 Faint LEFT frontal lobe petechial hemorrhage without lobar hematoma. 3 mm LEFT to RIGHT midline shift, no ventricular entrapment. Critical Value/emergent results were called by telephone at the time of interpretation on 04/28/2016 at 6:10 pm to Dr. Duffy Bruce , who verbally acknowledged these results. Electronically Signed   By: Elon Alas M.D.   On: 05/02/2016 18:16   Dg Chest Port 1 View  Result Date: 04/28/2016 CLINICAL DATA:  Encounter for central line placement. EXAM: PORTABLE CHEST 1 VIEW COMPARISON:  Radiograph earlier this day at [place] hour FINDINGS: Tip of the right internal jugular central venous catheter projects over the mid SVC. No pneumothorax. Endotracheal tube 4.9 cm from the carina. Unchanged heart size and mediastinal contours. Mild bibasilar atelectasis again seen. No pulmonary edema. IMPRESSION: Tip of the right central line in the mid SVC.  No pneumothorax. Unchanged  bibasilar atelectasis. Electronically Signed   By: Jeb Levering M.D.   On: 05/12/2016 23:30   Dg Chest Portable 1 View  Result Date: 05/02/2016 CLINICAL DATA:  Altered mental status, post intubation. EXAM: PORTABLE CHEST 1 VIEW COMPARISON:  05/16/2014 FINDINGS: Endotracheal tube is 5 cm above the carina. Heart is normal size. Minimal densities in the bases, likely atelectasis. No effusions or acute bony abnormality. IMPRESSION: Endotracheal tube 5 cm above the carina. Bibasilar atelectasis. Electronically Signed   By: Rolm Baptise M.D.   On: 04/20/2016 18:41   Dg Shoulder Right Port  Result Date: 05/06/2016 CLINICAL DATA:  Acute onset of right shoulder bruising. Found on floor of home. Initial encounter. EXAM: PORTABLE RIGHT SHOULDER COMPARISON:  Chest radiograph performed 05/16/2014 FINDINGS: There is no evidence of fracture or dislocation. The right humeral head is seated within the glenoid fossa. Mild degenerative change is noted at the right acromioclavicular joint. Mild soft tissue swelling is noted overlying the right shoulder. The visualized portions of the right lung are clear. The patient's endotracheal tube is seen ending 4 cm above the carina. IMPRESSION: No evidence of fracture or dislocation. Electronically Signed   By: Garald Balding M.D.   On: 04/27/2016 21:39    Procedures Date/Time: 05/12/2016 1:05 AM Performed by: Duffy Bruce Pre-anesthesia Checklist: Emergency Drugs available, Suction available and Timeout performed Oxygen Delivery Method: Non-rebreather mask Preoxygenation: Pre-oxygenation with 100% oxygen Intubation Type: Rapid sequence Ventilation: Mask ventilation without difficulty Laryngoscope Size: Glidescope and 3 Grade View: Grade I Tube size: 7.5 mm Number of attempts: 1 Airway Equipment and Method: Rigid stylet and Video-laryngoscopy Placement Confirmation: ETT inserted through vocal cords under direct vision,  Positive ETCO2,  Breath sounds checked-  equal and bilateral and CO2 detector Secured at: 21 cm Tube secured with: ETT holder Dental Injury: Teeth and Oropharynx as per pre-operative assessment  Future Recommendations: Recommend- induction with short-acting agent, and alternative techniques readily available    .Critical Care Performed by: Duffy Bruce Authorized by: Duffy Bruce   Critical care provider statement:    Critical care time (minutes):  45   Critical care time was exclusive of:  Separately billable procedures and treating other patients   Critical care was necessary to treat or prevent imminent or life-threatening deterioration of the following conditions:  Respiratory failure and CNS failure or compromise   Critical care was time spent personally by me on the following activities:  Development of treatment plan with patient  or surrogate, discussions with consultants, discussions with primary provider, examination of patient, ordering and performing treatments and interventions, ordering and review of laboratory studies, pulse oximetry, re-evaluation of patient's condition, ordering and review of radiographic studies, obtaining history from patient or surrogate and review of old charts   I assumed direction of critical care for this patient from another provider in my specialty: no     (including critical care time)  Medications Ordered in ED Medications  0.9 %  sodium chloride infusion (250 mLs Intravenous Rate/Dose Verify 05/12/16 0000)  famotidine (PEPCID) IVPB 20 mg premix (20 mg Intravenous Given 05/04/2016 2224)  levothyroxine (SYNTHROID, LEVOTHROID) injection 37.5 mcg (not administered)  albuterol (PROVENTIL) (2.5 MG/3ML) 0.083% nebulizer solution 2.5 mg (not administered)  fentaNYL (SUBLIMAZE) 2,500 mcg in sodium chloride 0.9 % 250 mL (10 mcg/mL) infusion (150 mcg/hr Intravenous Rate/Dose Change 05/12/16 0134)  fentaNYL (SUBLIMAZE) bolus via infusion 50 mcg (not administered)  propofol (DIPRIVAN) 1000  MG/100ML infusion (50 mcg/kg/min  90 kg Intravenous New Bag/Given 05/12/16 0226)  insulin aspart (novoLOG) injection 2-6 Units (2 Units Subcutaneous Given 05/12/16 0037)  hydrALAZINE (APRESOLINE) injection 10-20 mg (not administered)  sodium chloride (hypertonic) 3 % solution ( Intravenous Rate/Dose Verify 05/12/16 0000)  chlorhexidine gluconate (MEDLINE KIT) (PERIDEX) 0.12 % solution 15 mL (15 mLs Mouth Rinse Given 05/04/2016 2344)  MEDLINE mouth rinse (15 mLs Mouth Rinse Given 05/12/16 0125)  ipratropium-albuterol (DUONEB) 0.5-2.5 (3) MG/3ML nebulizer solution 3 mL (not administered)  ipratropium-albuterol (DUONEB) 0.5-2.5 (3) MG/3ML nebulizer solution 3 mL (0 mLs Nebulization Duplicate 63/01/60 1093)  etomidate (AMIDATE) injection 30 mg (0 mg Intravenous Duplicate 23/55/73 2202)  rocuronium (ZEMURON) injection 90 mg (0 mg Intravenous Duplicate 54/27/06 2376)  etomidate (AMIDATE) injection (30 mg Intravenous Given 05/01/2016 1826)  rocuronium (ZEMURON) injection (90 mg Intravenous Given 05/10/2016 1827)  fentaNYL (SUBLIMAZE) injection 50 mcg (50 mcg Intravenous Given 04/30/2016 1846)  sodium chloride 0.9 % bolus 1,000 mL (1,000 mLs Intravenous Given 04/28/2016 2237)     Initial Impression / Assessment and Plan / ED Course  I have reviewed the triage vital signs and the nursing notes.  Pertinent labs & imaging results that were available during my care of the patient were reviewed by me and considered in my medical decision making (see chart for details).  Clinical Course     59 yo F with PMHx of COPD here with acute onset AMS. On arrival, pt noted to have significant right-sided neglect, no spontaneous movement of RUE and RLE noted. Pt taken immediately to CT scanner for CT head which shows dense left MCA stroke. On returning to room, pt increasingly altered, now with agonal respirations. Gag intact but given extent of CVA, AMS, and c/f airway protection, pt intubated as above. Pt tolerated well.  Prior to intubation, I discussed imaging findings with family who are in agreement with plan and aware. Pt is FULL CODE. Neuro consulted and will admit to ICU.  Labs as above, largely unremarkable. Suspect acute MCA stroke with subsequent AMS. Admitted to ICU.  Final Clinical Impressions(s) / ED Diagnoses   Final diagnoses:  Somnolence  Acute ischemic left middle cerebral artery (MCA) stroke (HCC)  Endotracheally intubated    New Prescriptions Current Discharge Medication List       Duffy Bruce, MD 05/12/16 [phone]

## 2016-05-11 NOTE — Progress Notes (Signed)
Respiratory Rate increased to 26 from 20 per Dr Isaiah SergeMannam. ABG is pending.

## 2016-05-11 NOTE — ED Notes (Signed)
ICU called for report

## 2016-05-11 NOTE — H&P (Signed)
PULMONARY / CRITICAL CARE MEDICINE   Name: Kim FlorasBernice J Mannis MRN: [id number] DOB: [birth year]    ADMISSION DATE:  05/01/2016 CONSULTATION DATE:  05/12/2016  REFERRING MD:  Dr. Erma HeritageIsaacs EDP  CHIEF COMPLAINT:  CVA  HISTORY OF PRESENT ILLNESS:   60 year old female with PMH as below, which is significant for COPD and her son spectulates HTN and HLD. No medical history on file. She was last known well at 12/26 at about 630PM. When her daughter went to visit her 12/27 she was unable to get into the house and after breaking in, found the patient to be down in the bathroom and unresponsive. She was rushed to ED where she was promptly intubated for airway protection. CT of the head demonstrated large L MCA distribution infarct with 7224m L>R midline shift. PCCM asked to admit with neurology in consultation.  PAST MEDICAL HISTORY :  She  has no past medical history on file.  PAST SURGICAL HISTORY: She  has no past surgical history on file.  No Known Allergies  No current facility-administered medications on file prior to encounter.    No current outpatient prescriptions on file prior to encounter.    FAMILY HISTORY:  Her has no family status information on file.    SOCIAL HISTORY: She    REVIEW OF SYSTEMS:   unable  SUBJECTIVE:  unbale  VITAL SIGNS: BP 146/92   Pulse 84   Temp 98.6 F (37 C) (Oral)   Resp 20   Ht 5\' 4"  (1.626 m)   Wt 90 kg (198 lb 6.6 oz)   SpO2 97%   BMI 34.06 kg/m   HEMODYNAMICS:    VENTILATOR SETTINGS: Vent Mode: PRVC FiO2 (%):  [40 %-100 %] 40 % Set Rate:  [18 bmp-20 bmp] 20 bmp Vt Set:  [440 mL] 440 mL PEEP:  [5 cmH20] 5 cmH20 Plateau Pressure:  [17 cmH20] 17 cmH20  INTAKE / OUTPUT: No intake/output data recorded.  PHYSICAL EXAMINATION: General:  Obese female in NAD on vent Neuro:  Comatose HEENT:  [place]/AT, Pupils pinpoint and minimally responsive.  Cardiovascular:  RRR, no MRG Lungs:  Diminished, Clear Abdomen:  Soft,  non-distended Musculoskeletal:  No acute deformity Skin:  Bruising to R shoulder  LABS:  BMET  Recent Labs Lab 04/29/2016 1817 05/02/2016 1835  NA 138 140  K 4.0 4.0  CL 101 101  CO2 24  --   BUN 13 15  CREATININE 0.63 0.50  GLUCOSE 151* 160*    Electrolytes  Recent Labs Lab 05/14/2016 1817  CALCIUM 9.0    CBC  Recent Labs Lab 04/25/2016 1817 05/07/2016 1835  WBC 13.1*  --   HGB 15.4* 16.3*  HCT 45.2 48.0*  PLT 249  --     Coag's  Recent Labs Lab 04/29/2016 1817  INR 1.03    Sepsis Markers  Recent Labs Lab 05/15/2016 1836  LATICACIDVEN 3.16*    ABG  Recent Labs Lab 05/07/2016 1858  PHART 7.327*  PCO2ART 60.3*  PO2ART 479.0*    Liver Enzymes  Recent Labs Lab 05/14/2016 1817  AST 38  ALT 28  ALKPHOS 67  BILITOT 0.6  ALBUMIN 3.5    Cardiac Enzymes No results for input(s): TROPONINI, PROBNP in the last 168 hours.  Glucose No results for input(s): GLUCAP in the last 168 hours.  Imaging Ct Head Wo Contrast  Result Date: 04/26/2016 CLINICAL DATA:  Altered mental status. Found unresponsive on bathroom floor. EXAM: CT HEAD WITHOUT CONTRAST TECHNIQUE: Contiguous axial images  were obtained from the base of the skull through the vertex without intravenous contrast. COMPARISON:  CT HEAD March 10, 2010 FINDINGS: BRAIN: Blurring of the LEFT frontotemporal parietal gray-white matter with sulcal effacement. Patchy hypodensity LEFT basal ganglia. Faint focal density LEFT frontal subcortical white matter. Partially effaced LEFT lateral ventricle with 3 mm LEFT-to-RIGHT midline shift. No abnormal extra-axial fluid collections. Basal cisterns are patent. VASCULAR: Dense LEFT M1 segment. Mild calcific atherosclerosis the carotid siphon. SKULL/SOFT TISSUES: No skull fracture. No significant soft tissue swelling. Included LEFT maxillary periapical lucency/abscess. ORBITS/SINUSES: The included ocular globes and orbital contents are normal.Minimal LEFT mastoid effusion.  Paranasal sinuses are well-aerated. OTHER: None. 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 Faint LEFT frontal lobe petechial hemorrhage without lobar hematoma. 3 mm LEFT to RIGHT midline shift, no ventricular entrapment. Critical Value/emergent results were called by telephone at the time of interpretation on 05/02/2016 at 6:10 pm to Dr. Shaune PollackAMERON ISAACS , who verbally acknowledged these results. Electronically Signed   By: Awilda Metroourtnay  Bloomer M.D.   On: 04/20/2016 18:16   Dg Chest Portable 1 View  Result Date: 05/12/2016 CLINICAL DATA:  Altered mental status, post intubation. EXAM: PORTABLE CHEST 1 VIEW COMPARISON:  05/16/2014 FINDINGS: Endotracheal tube is 5 cm above the carina. Heart is normal size. Minimal densities in the bases, likely atelectasis. No effusions or acute bony abnormality. IMPRESSION: Endotracheal tube 5 cm above the carina. Bibasilar atelectasis. Electronically Signed   By: Charlett NoseKevin  Dover M.D.   On: 04/24/2016 18:41     STUDIES:  CT head 12/27 > 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 Faint LEFT frontal lobe petechial hemorrhage without lobar hematoma. 3 mm LEFT to RIGHT midline shift, no ventricular entrapment.  Shoulder XR 12/27 >  CULTURES:   ANTIBIOTICS:   SIGNIFICANT EVENTS: 12/27 >  LINES/TUBES: ETT 12/27 >  ASSESSMENT / PLAN:  PULMONARY A: Inability to protect airway in setting stroke COPD without acute exacerbation  P:   Full vent support Follow ABG CXR VAP bundle Scheduled duonebs and PRN albuterol.  CARDIOVASCULAR A:  HTN HLD  P:  Telemetry monitoring Holding outpatient amlodipine, atenolol Keep SBP < 170mmHg PRN hydralazine   RENAL A:   No acute issues  P:   Follow BMP  GASTROINTESTINAL A:   No acute issues  P:   NPO Pepcid  HEMATOLOGIC A:   Leukocytosis  P:  Monitor  INFECTIOUS A:   No acute issues  P:    Monitors  ENDOCRINE A:   Hyperglycemia without history of DM  P:   CBG monitoring and SSI  NEUROLOGIC A:   Acute L CVA 3mm midline shift  P:   RASS goal: 0 to -1 Neurology following 3% saline per neuro Echo, MRI, Echo, Carotid dopplers pending   FAMILY  - Updates: Son updated in ER  - Inter-disciplinary family meet or Palliative Care meeting due by:  1/3   Joneen RoachPaul Hoffman, AGACNP-BC [facility] Pager [phone] or [phone]  04/29/2016 8:15 PM

## 2016-05-12 ENCOUNTER — Inpatient Hospital Stay (HOSPITAL_COMMUNITY): Payer: Medicare Other

## 2016-05-12 DIAGNOSIS — G9340 Encephalopathy, unspecified: Secondary | ICD-10-CM

## 2016-05-12 DIAGNOSIS — I639 Cerebral infarction, unspecified: Secondary | ICD-10-CM

## 2016-05-12 DIAGNOSIS — J9601 Acute respiratory failure with hypoxia: Secondary | ICD-10-CM

## 2016-05-12 DIAGNOSIS — I10 Essential (primary) hypertension: Secondary | ICD-10-CM

## 2016-05-12 LAB — RAPID URINE DRUG SCREEN, HOSP PERFORMED
AMPHETAMINES: NOT DETECTED
BENZODIAZEPINES: NOT DETECTED
Barbiturates: NOT DETECTED
Cocaine: NOT DETECTED
OPIATES: NOT DETECTED
Tetrahydrocannabinol: NOT DETECTED

## 2016-05-12 LAB — BLOOD GAS, ARTERIAL
Acid-base deficit: 0.8 mmol/L (ref 0.0–2.0)
BICARBONATE: 24.4 mmol/L (ref 20.0–28.0)
Drawn by: 448981
FIO2: 40
LHR: 6 {breaths}/min
O2 Saturation: 95.4 %
PATIENT TEMPERATURE: 97.5
PCO2 ART: 46.8 mmHg (ref 32.0–48.0)
PEEP/CPAP: 5 cmH2O
Pressure control: 10 cmH2O
pH, Arterial: 7.333 — ABNORMAL LOW (ref 7.350–7.450)
pO2, Arterial: 79.6 mmHg — ABNORMAL LOW (ref 83.0–108.0)

## 2016-05-12 LAB — CBC
HCT: 40.4 % (ref 36.0–46.0)
HEMOGLOBIN: 13.1 g/dL (ref 12.0–15.0)
MCH: 30.5 pg (ref 26.0–34.0)
MCHC: 32.4 g/dL (ref 30.0–36.0)
MCV: 94 fL (ref 78.0–100.0)
Platelets: 215 10*3/uL (ref 150–400)
RBC: 4.3 MIL/uL (ref 3.87–5.11)
RDW: 12.7 % (ref 11.5–15.5)
WBC: 14.5 10*3/uL — ABNORMAL HIGH (ref 4.0–10.5)

## 2016-05-12 LAB — GLUCOSE, CAPILLARY
GLUCOSE-CAPILLARY: 110 mg/dL — AB (ref 65–99)
GLUCOSE-CAPILLARY: 122 mg/dL — AB (ref 65–99)
GLUCOSE-CAPILLARY: 160 mg/dL — AB (ref 65–99)
GLUCOSE-CAPILLARY: 172 mg/dL — AB (ref 65–99)
Glucose-Capillary: 114 mg/dL — ABNORMAL HIGH (ref 65–99)
Glucose-Capillary: 170 mg/dL — ABNORMAL HIGH (ref 65–99)

## 2016-05-12 LAB — BASIC METABOLIC PANEL
Anion gap: 9 (ref 5–15)
BUN: 13 mg/dL (ref 6–20)
CHLORIDE: 105 mmol/L (ref 101–111)
CO2: 24 mmol/L (ref 22–32)
CREATININE: 0.59 mg/dL (ref 0.44–1.00)
Calcium: 8 mg/dL — ABNORMAL LOW (ref 8.9–10.3)
GFR calc Af Amer: >60 mL/min (ref >60)
GFR calc non Af Amer: >60 mL/min (ref >60)
Glucose, Bld: 115 mg/dL — ABNORMAL HIGH (ref 65–99)
Potassium: 3.4 mmol/L — ABNORMAL LOW (ref 3.5–5.1)
SODIUM: 138 mmol/L (ref 135–145)

## 2016-05-12 LAB — SODIUM
Sodium: 143 mmol/L (ref 135–145)
Sodium: 152 mmol/L — ABNORMAL HIGH (ref 135–145)
Sodium: 154 mmol/L — ABNORMAL HIGH (ref 135–145)

## 2016-05-12 LAB — MAGNESIUM: MAGNESIUM: 1.9 mg/dL (ref 1.7–2.4)

## 2016-05-12 LAB — MRSA PCR SCREENING: MRSA BY PCR: NEGATIVE

## 2016-05-12 LAB — PHOSPHORUS: Phosphorus: 3.1 mg/dL (ref 2.5–4.6)

## 2016-05-12 MED ORDER — SODIUM CHLORIDE 0.9 % IV BOLUS (SEPSIS)
500.0000 mL | Freq: Once | INTRAVENOUS | Status: AC
  Administered 2016-05-12: 500 mL via INTRAVENOUS

## 2016-05-12 MED ORDER — ASPIRIN 325 MG PO TABS
325.0000 mg | ORAL_TABLET | Freq: Every day | ORAL | Status: DC
  Administered 2016-05-12: 325 mg
  Filled 2016-05-12: qty 1

## 2016-05-12 MED ORDER — MAGNESIUM SULFATE 2 GM/50ML IV SOLN
2.0000 g | Freq: Once | INTRAVENOUS | Status: AC
  Administered 2016-05-12: 2 g via INTRAVENOUS
  Filled 2016-05-12: qty 50

## 2016-05-12 MED ORDER — STROKE: EARLY STAGES OF RECOVERY BOOK
Freq: Once | Status: AC
  Administered 2016-05-13: 01:00:00
  Filled 2016-05-12 (×2): qty 1

## 2016-05-12 MED ORDER — IPRATROPIUM-ALBUTEROL 0.5-2.5 (3) MG/3ML IN SOLN
3.0000 mL | Freq: Three times a day (TID) | RESPIRATORY_TRACT | Status: DC
  Administered 2016-05-12 – 2016-05-13 (×4): 3 mL via RESPIRATORY_TRACT
  Filled 2016-05-12 (×4): qty 3

## 2016-05-12 MED ORDER — SODIUM CHLORIDE 23.4 % INJECTION (4 MEQ/ML) FOR IV ADMINISTRATION
30.0000 mL | Freq: Once | INTRAVENOUS | Status: AC
  Administered 2016-05-12: 30 mL via INTRAVENOUS
  Filled 2016-05-12: qty 30

## 2016-05-12 MED ORDER — POTASSIUM CHLORIDE 20 MEQ/15ML (10%) PO SOLN
40.0000 meq | Freq: Three times a day (TID) | ORAL | Status: AC
  Administered 2016-05-12 (×2): 40 meq
  Filled 2016-05-12 (×2): qty 30

## 2016-05-12 NOTE — Progress Notes (Addendum)
*  PRELIMINARY RESULTS* Vascular Ultrasound Carotid Duplex (Doppler) has been completed.  Difficult exam.   There is no hemodynamically significant stenosis in the visualized portions of the right common and internal carotid arteries.   Difficult evaluation of the left carotid arteries.  A significant amount of soft plaque is noted in both the internal and external carotid arteries.  Difficult to determine if there is an occlusion and collateral flow vs string sign.  More imaging is likely needed. Right vertebral not visualized, Left vertebral artery appears patent and antegrade.   Kim FischerCharlotte C Denaja Mays 05/12/2016, 5:03 PM

## 2016-05-12 NOTE — Progress Notes (Signed)
eLink Physician-Brief Progress Note Patient Name: Marylee FlorasBernice J Odonoghue DOB: 1956/01/15 MRN: [id number]   Date of Service  05/12/2016  HPI/Events of Note  fent 400 and diprivan 50 and still some vent dysnchrony per RN  On cam care - currently wave forms overall ok but some wasted breath  eICU Interventions  Increase range on diprivan     Intervention Category Major Interventions: Other:  Malaijah Houchen 05/12/2016, 6:19 AM

## 2016-05-12 NOTE — Progress Notes (Signed)
Patient ID: Kim Mays Wolaver, female   DOB: 1956-02-17, 60 y.o.   MRN: [id number] \ \  eLink Physician Progress Note and Electrolyte Replacement  Patient Name: Kim Mays Deman DOB: 1956-02-17 MRN: [id number]  Date of Service  05/12/2016   HPI/Events of Note  RN calling re oligura  Recent Labs Lab August 19, 2015 1817 August 19, 2015 1835 August 19, 2015 2137  NA 138 140 139  K 4.0 4.0  --   CL 101 101  --   CO2 24  --   --   GLUCOSE 151* 160*  --   BUN 13 15  --   CREATININE 0.63 0.50  --   CALCIUM 9.0  --   --   MG  --   --  1.9  PHOS  --   --  3.9    Recent Labs Lab August 19, 2015 1836 August 19, 2015 2137  LATICACIDVEN 3.16* 2.7*    No results for input(s): TROPONINI in the last 168 hours.   Estimated Creatinine Clearance: 83.3 mL/min (by C-G formula based on SCr of 0.5 mg/dL).  Intake/Output      12/27 0701 - 12/28 0700   I.V. (mL/kg) 432 (4.8)   IV Piggyback 1050   Total Intake(mL/kg) 1482 (16.5)   Urine (mL/kg/hr) 135   Total Output 135   Net +1347        - I/O DETAILED x 24h    Total I/O In: 1482 [I.V.:432; IV Piggyback:1050] Out: 135 [Urine:135] - I/O THIS SHIFT    ASSESSMENT Oliguria Elevated lactateb but slowly clearing Mag < 2g  eICURN Interventions  Fluid bolus Replete mag   ASSESSMENT: MAJOR ELECTROLYTE      Dr. Kalman ShanMurali Finas Delone, M.D., F.C.C.P Pulmonary and Critical Care Medicine Staff Physician [facility] [place] [facility] Pager: [phone], If no answer or between  15:00h - 7:00h: call [phone]  05/12/2016 3:51 AM

## 2016-05-12 NOTE — Progress Notes (Signed)
Spoke with son bedside, informed of poor prognosis.  After discussion, decision was made to make patient a full DNR, no trach/peg.  Son is contemplating time for withdrawal.  Alyson ReedyWesam G. Chabely Norby, M.D. [facility]. Pager: [phone]. After hours pager: [phone].

## 2016-05-12 NOTE — H&P (Signed)
PULMONARY / CRITICAL CARE MEDICINE   Name: Kim Mays MRN: [id number] DOB: Jul 02, 1955    ADMISSION DATE:  2016-02-05 CONSULTATION DATE:  2016-02-05  REFERRING MD:  Dr. Erma HeritageIsaacs EDP  CHIEF COMPLAINT:  CVA  HISTORY OF PRESENT ILLNESS:   60 year old female with PMH as below, which is significant for COPD and her son spectulates HTN and HLD. No medical history on file. She was last known well at 12/26 at about 630PM. When her daughter went to visit her 12/27 she was unable to get into the house and after breaking in, found the patient to be down in the bathroom and unresponsive. She was rushed to ED where she was promptly intubated for airway protection. CT of the head demonstrated large L MCA distribution infarct with 4468m L>R midline shift. PCCM asked to admit with neurology in consultation.  SUBJECTIVE:  No events overnight, unresponsive  VITAL SIGNS: BP 112/71   Pulse 61   Temp 97.5 F (36.4 C) (Axillary)   Resp (!) 26   Ht 5\' 4"  (1.626 m)   Wt 94.9 kg (209 lb 3.5 oz)   LMP  (LMP Unknown)   SpO2 100%   BMI 35.91 kg/m   HEMODYNAMICS:    VENTILATOR SETTINGS: Vent Mode: PRVC FiO2 (%):  [40 %-100 %] 40 % Set Rate:  [18 bmp-26 bmp] 26 bmp Vt Set:  [440 mL] 440 mL PEEP:  [5 cmH20] 5 cmH20 Plateau Pressure:  [17 cmH20-26 cmH20] 23 cmH20  INTAKE / OUTPUT: I/O last 3 completed shifts: In: 2457.8 [I.V.:857.8; IV Piggyback:1600] Out: 179 [Urine:179]  PHYSICAL EXAMINATION: General:  Obese female in NAD on vent Neuro:  Comatose, moves right arm to pain  HEENT:  [place]/AT, Pupils pinpoint and minimally responsive.  Cardiovascular:  RRR, no MRG Lungs:  Diminished, Clear Abdomen:  Soft, non-distended Musculoskeletal:  No acute deformity Skin:  Bruising to R shoulder  LABS:  BMET  Recent Labs Lab 01/14/16 1817 01/14/16 1835 01/14/16 2137 05/12/16 0300 05/12/16 0805  NA 138 140 139 138 143  K 4.0 4.0  --  3.4*  --   CL 101 101  --  105  --   CO2 24  --   --  24  --    BUN 13 15  --  13  --   CREATININE 0.63 0.50  --  0.59  --   GLUCOSE 151* 160*  --  115*  --    Electrolytes  Recent Labs Lab 01/14/16 1817 01/14/16 2137 05/12/16 0300  CALCIUM 9.0  --  8.0*  MG  --  1.9 1.9  PHOS  --  3.9 3.1   CBC  Recent Labs Lab 01/14/16 1817 01/14/16 1835 05/12/16 0300  WBC 13.1*  --  14.5*  HGB 15.4* 16.3* 13.1  HCT 45.2 48.0* 40.4  PLT 249  --  215   Coag's  Recent Labs Lab 01/14/16 1817  INR 1.03   Sepsis Markers  Recent Labs Lab 01/14/16 1836 01/14/16 2137  LATICACIDVEN 3.16* 2.7*   ABG  Recent Labs Lab 01/14/16 1858  PHART 7.327*  PCO2ART 60.3*  PO2ART 479.0*   Liver Enzymes  Recent Labs Lab 01/14/16 1817  AST 38  ALT 28  ALKPHOS 67  BILITOT 0.6  ALBUMIN 3.5   Cardiac Enzymes No results for input(s): TROPONINI, PROBNP in the last 168 hours.  Glucose  Recent Labs Lab 05/12/16 0032 05/12/16 0348 05/12/16 0832  GLUCAP 122* 114* 160*   Imaging Ct Head Wo Contrast  Result Date: 2016-05-21 CLINICAL DATA:  Altered mental status. Found unresponsive on bathroom floor. EXAM: CT HEAD WITHOUT CONTRAST TECHNIQUE: Contiguous axial images were obtained from the base of the skull through the vertex without intravenous contrast. COMPARISON:  CT HEAD March 10, 2010 FINDINGS: BRAIN: Blurring of the LEFT frontotemporal parietal gray-white matter with sulcal effacement. Patchy hypodensity LEFT basal ganglia. Faint focal density LEFT frontal subcortical white matter. Partially effaced LEFT lateral ventricle with 3 mm LEFT-to-RIGHT midline shift. No abnormal extra-axial fluid collections. Basal cisterns are patent. VASCULAR: Dense LEFT M1 segment. Mild calcific atherosclerosis the carotid siphon. SKULL/SOFT TISSUES: No skull fracture. No significant soft tissue swelling. Included LEFT maxillary periapical lucency/abscess. ORBITS/SINUSES: The included ocular globes and orbital contents are normal.Minimal LEFT mastoid effusion.  Paranasal sinuses are well-aerated. OTHER: None. 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 Faint LEFT frontal lobe petechial hemorrhage without lobar hematoma. 3 mm LEFT to RIGHT midline shift, no ventricular entrapment. Critical Value/emergent results were called by telephone at the time of interpretation on 05-21-2016 at 6:10 pm to Dr. Shaune Pollack , who verbally acknowledged these results. Electronically Signed   By: Awilda Metro M.D.   On: 21-May-2016 18:16   Dg Chest Port 1 View  Result Date: 05/12/2016 CLINICAL DATA:  Stroke.  Endotracheal tube.  Ventilator dependent. EXAM: PORTABLE CHEST 1 VIEW COMPARISON:  One-view chest x-ray 2016-05-21. FINDINGS: Endotracheal tube is stable in position. The heart size is normal. An NG tube courses off the inferior border the film. A right IJ line terminates at the cavoatrial junction. Emphysematous changes are noted. Atherosclerotic calcifications are present at the aortic arch. Minimal bibasilar airspace disease likely reflects atelectasis, unchanged. IMPRESSION: 1. Stable support apparatus. 2. Emphysema. 3. Minimal bibasilar airspace disease likely reflects atelectasis. Electronically Signed   By: Marin Roberts M.D.   On: 05/12/2016 07:34   Dg Chest Port 1 View  Result Date: 2016-05-21 CLINICAL DATA:  Encounter for central line placement. EXAM: PORTABLE CHEST 1 VIEW COMPARISON:  Radiograph earlier this day at 1827 hour FINDINGS: Tip of the right internal jugular central venous catheter projects over the mid SVC. No pneumothorax. Endotracheal tube 4.9 cm from the carina. Unchanged heart size and mediastinal contours. Mild bibasilar atelectasis again seen. No pulmonary edema. IMPRESSION: Tip of the right central line in the mid SVC.  No pneumothorax. Unchanged bibasilar atelectasis. Electronically Signed   By: Rubye Oaks M.D.   On: 2016-05-21 23:30   Dg Chest Portable 1 View  Result  Date: 05/21/16 CLINICAL DATA:  Altered mental status, post intubation. EXAM: PORTABLE CHEST 1 VIEW COMPARISON:  05/16/2014 FINDINGS: Endotracheal tube is 5 cm above the carina. Heart is normal size. Minimal densities in the bases, likely atelectasis. No effusions or acute bony abnormality. IMPRESSION: Endotracheal tube 5 cm above the carina. Bibasilar atelectasis. Electronically Signed   By: Charlett Nose M.D.   On: 05-21-16 18:41   Dg Shoulder Right Port  Result Date: May 21, 2016 CLINICAL DATA:  Acute onset of right shoulder bruising. Found on floor of home. Initial encounter. EXAM: PORTABLE RIGHT SHOULDER COMPARISON:  Chest radiograph performed 05/16/2014 FINDINGS: There is no evidence of fracture or dislocation. The right humeral head is seated within the glenoid fossa. Mild degenerative change is noted at the right acromioclavicular joint. Mild soft tissue swelling is noted overlying the right shoulder. The visualized portions of the right lung are clear. The patient's endotracheal tube is seen ending 4 cm above the carina. IMPRESSION: No evidence  of fracture or dislocation. Electronically Signed   By: Roanna RaiderJeffery  Chang M.D.   On: 2015-12-31 21:39   Dg Abd Portable 1v  Result Date: 05/12/2016 CLINICAL DATA:  Orogastric tube placement EXAM: PORTABLE ABDOMEN - 1 VIEW COMPARISON:  CT abdomen and pelvis May 05, 2016 FINDINGS: Orogastric tube tip and side port are in the stomach. Visualized bowel gas pattern is unremarkable. No obstruction or free air evident. Visualized lung bases are clear. IMPRESSION: Orogastric tube tip and side port in stomach. Bowel gas pattern unremarkable. Electronically Signed   By: Bretta BangWilliam  Woodruff III M.D.   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 Faint LEFT frontal lobe petechial hemorrhage without lobar hematoma. 3 mm LEFT to RIGHT midline shift, no ventricular entrapment.   Shoulder XR 12/27 >  CULTURES:   ANTIBIOTICS:   SIGNIFICANT EVENTS: 12/27 >  LINES/TUBES: ETT 12/27 >  ASSESSMENT / PLAN:  PULMONARY A: Inability to protect airway in setting stroke COPD without acute exacerbation  P:   Full vent support - change to PCV Follow ABG CXR VAP bundle Scheduled duonebs and PRN albuterol.  CARDIOVASCULAR A:  HTN HLD  P:  Telemetry monitoring Holding outpatient amlodipine, atenolol Keep SBP < 170mmHg PRN hydralazine   RENAL A:   No acute issues  P:   Follow BMP Replace electrolytes as indicated KVO IVF  GASTROINTESTINAL A:   No acute issues  P:   NPO Pepcid TF per nutrition  HEMATOLOGIC A:   Leukocytosis  P:  Monitor CBC in AM  INFECTIOUS A:   No acute issues  P:   Monitors WBC and fever curve  ENDOCRINE A:   Hyperglycemia without history of DM  P:   CBG monitoring and SSI  NEUROLOGIC A:   Acute L CVA 3mm midline shift  P:   RASS goal: 0 to -1 Neurology following 3% saline per neuro Echo, MRI, Echo, Carotid dopplers pending  FAMILY  - Updates: No family bedside, need to speak to family regarding plan of care.  - Inter-disciplinary family meet or Palliative Care meeting due by:  1/3  The patient is critically ill with multiple organ systems failure and requires high complexity decision making for assessment and support, frequent evaluation and titration of therapies, application of advanced monitoring technologies and extensive interpretation of multiple databases.   Critical Care Time devoted to patient care services described in this note is  35  Minutes. This time reflects time of care of this signee Dr Koren BoundWesam Saphire Barnhart. This critical care time does not reflect procedure time, or teaching time or supervisory time of PA/NP/Med student/Med Resident etc but could involve care discussion time.  Alyson ReedyWesam G. Ranard Harte, M.D. [facility]/Critical Care Medicine. Pager: [phone]. After hours pager:  [phone].

## 2016-05-12 NOTE — Progress Notes (Signed)
Initial Nutrition Assessment  DOCUMENTATION CODES:   Obesity unspecified  INTERVENTION:  If plan to initiate TF: Initiate TF via OGT with Vital High Protein at goal rate of 25 ml/h (600 ml per day) and Prostat 30 ml QID to provide 1000 kcals, 112.5 gm protein, 504 ml free water daily. TF regimen plus current rate of propofol will provide 1428 kcal per 24 hours (107% of estimated needs).  Once propofol is discontinued, increase rate of Vital High Protein to 55 ml/hr and d/c Pro-stat to provide 1320 kcal, 116 grams of protein, and 1109 ml of fluid   NUTRITION DIAGNOSIS:   Inadequate oral intake related to inability to eat as evidenced by NPO status.   GOAL:   Provide needs based on ASPEN/SCCM guidelines   MONITOR:   Vent status, TF tolerance, Skin, I & O's, Weight trends, Labs  REASON FOR ASSESSMENT:   Ventilator    ASSESSMENT:   60 year old female with PMH as below, which is significant for COPD and her son spectulates HTN and HLD. She was rushed to ED where she was promptly intubated for airway protection. CT of the head demonstrated large L MCA distribution infarct with 2279m L>R midline shift   Per chart, prognosis is poor, pt is full DNR, no trach/PEG; Son is contemplating time for withdrawal.   Patient is currently intubated on ventilator support MV: 10.8 L/min Temp (24hrs), Avg:98.2 F (36.8 C), Min:97.5 F (36.4 C), Max:98.6 F (37 C)  Propofol: 16.2 ml/hr (provides 428 kcal per 24 hours)  Labs: low calcium  Diet Order:  Diet NPO time specified  Skin:  Reviewed, no issues  Last BM:  unknown  Height:   Ht Readings from Last 1 Encounters:  08-18-15 5\' 4"  (1.626 m)    Weight:   Wt Readings from Last 1 Encounters:  05/12/16 209 lb 3.5 oz (94.9 kg)    Ideal Body Weight:  54.5 kg  BMI:  Body mass index is 35.91 kg/m.  Estimated Nutritional Needs:   Kcal:  [id number]  Protein:  >/=109 grams  Fluid:  per MD  EDUCATION NEEDS:   No education  needs identified at this time  Dorothea Ogleeanne Arrie Borrelli RD, CSP, LDN Inpatient Clinical Dietitian Pager: [phone] After Hours Pager: [phone]

## 2016-05-12 NOTE — Progress Notes (Signed)
STROKE TEAM PROGRESS NOTE   HISTORY OF PRESENT ILLNESS (per record) Kim Mays is an 60 y.o. female with a history of hypertension, hyperlipidemia and COPD who was found on the floor of her home this afternoon speechless and unable to move her right side. She was last known well last night 05/10/2016 when she talked with a family member on the phone. She has no previous history of stroke nor TIA. CT scan of her head showed a large left MCA territory acute infarction, as well as hyperdense left MCA sign, likely acute M1 thrombus. Patient was intubated in the ED for airway protection and subsequently laced on propofol drip. NIH score was difficult to assess because she was completely sedated. Patient was not administered IV t-PA secondary to beyond time under for treatment consideration; large completed infarct. She was admitted to the neuro ICU for further evaluation and treatment.   SUBJECTIVE (INTERVAL HISTORY) No family is present at the bedside. Her stroke is large and is at risk for herniation. Her son arrived after rounds.   OBJECTIVE Temp:  [97.5 F (36.4 C)-98.6 F (37 C)] 97.5 F (36.4 C) (12/28 0800) Pulse Rate:  [60-127] 61 (12/28 0724) Cardiac Rhythm: Normal sinus rhythm;Sinus bradycardia (12/27 2215) Resp:  [10-26] 26 (12/28 0724) BP: (95-228)/(67-130) 112/71 (12/28 0724) SpO2:  [94 %-100 %] 100 % (12/28 0724) FiO2 (%):  [40 %-100 %] 40 % (12/28 0724) Weight:  [90 kg (198 lb 6.6 oz)-94.9 kg (209 lb 3.5 oz)] 94.9 kg (209 lb 3.5 oz) (12/28 0500)  CBC:  Recent Labs Lab 05/14/2016 1817 05/05/2016 1835 05/12/16 0300  WBC 13.1*  --  14.5*  NEUTROABS 10.9*  --   --   HGB 15.4* 16.3* 13.1  HCT 45.2 48.0* 40.4  MCV 91.9  --  94.0  PLT 249  --  215    Basic Metabolic Panel:  Recent Labs Lab 05/08/2016 1817 05/15/2016 1835 05/15/2016 2137 05/12/16 0300 05/12/16 0805  NA 138 140 139 138 143  K 4.0 4.0  --  3.4*  --   CL 101 101  --  105  --   CO2 24  --   --  24  --    GLUCOSE 151* 160*  --  115*  --   BUN 13 15  --  13  --   CREATININE 0.63 0.50  --  0.59  --   CALCIUM 9.0  --   --  8.0*  --   MG  --   --  1.9 1.9  --   PHOS  --   --  3.9 3.1  --     Lipid Panel:     Component Value Date/Time   TRIG 149 05/05/2016 2137   HgbA1c: No results found for: HGBA1C Urine Drug Screen:    Component Value Date/Time   LABOPIA NONE DETECTED 05/07/2016 2331   COCAINSCRNUR NONE DETECTED 04/27/2016 2331   LABBENZ NONE DETECTED 05/14/2016 2331   AMPHETMU NONE DETECTED 04/19/2016 2331   THCU NONE DETECTED 05/07/2016 2331   LABBARB NONE DETECTED 04/15/2016 2331      IMAGING  Ct Head Wo Contrast 05/05/2016 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 Faint LEFT frontal lobe petechial hemorrhage without lobar hematoma. 3 mm LEFT to RIGHT midline shift, no ventricular entrapment.   Dg Chest Port 1 View 05/12/2016 1. Stable support apparatus. 2. Emphysema. 3. Minimal bibasilar airspace disease likely reflects atelectasis.  04/30/2016 Tip of the right central  line in the mid SVC.  No pneumothorax. Unchanged bibasilar atelectasis.  04/21/2016 Endotracheal tube 5 cm above the carina. Bibasilar atelectasis.   Dg Shoulder Right Port 04/18/2016 No evidence of fracture or dislocation.   Dg Abd Portable 1v 05/12/2016 Orogastric tube tip and side port in stomach. Bowel gas pattern unremarkable.    PHYSICAL EXAM Middle-aged obese Caucasian lady who is sedated and  intubated  She has a right internal jugular central IV line. . Afebrile. Head is nontraumatic. Neck is supple without bruit.    Cardiac exam no murmur or gallop. Lungs are clear to auscultation. Distal pulses are well felt. Neurologic  Exam : She is sedated and intubated. Comatose. Right gaze deviation. Left gaze palsy. Pupils 3 mm sluggishly reactive. Slight  Eye is with hypertropia of the right eye. Fundi were not visualized. Dense right hemiplegia with  hypo-tonia. Withdraws left upper and lower extremity to pain. Right plantar upgoing left downgoing. ASSESSMENT/PLAN Kim Mays is a 60 y.o. female with history of Hypertension, hyperlipidemia and COPD found down with right hemiparesis which progressed to respiratory failure. She did not receive IV t-PA due to delay in arrival.   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 Infarct embolic secondary to unknown source  Resultant  vent dependent respiratory failure, right hemiparesis, global aphasia, dysphagia  MRI  ordered  MRA  ordered  Carotid Doppler  ordered  2D Echo  ordered  LDL ordered  HgbA1c ordered  SCDs for VTE prophylaxis  Diet NPO time specified  No antithrombotic prior to admission, now on No antithrombotic. Add aspirin per tube  Ongoing aggressive stroke risk factor management  Therapy recommendations:  pending   Disposition:  pending   Stroke is massive, not compatible with positive neuro prognosis. Will aggressively treat for now and Dr. Pearlean Brownie to meet with family   Acute respiratory Failure  Secondary to large stroke  Intubated in ED  Sedated on vent  CCM on board  Cytotoxic cerebral edema / induced hypernatremia  3 mm left to right shift  NA on admission 139, up to 143 this am.  On 3% normal saline at 50 mls an hour  Give 23.4% saline injection, now  Increase 3% saline to 70 mls an hour  Check Na q6h  Hypertensive Emergency  Blood pressure as high as 228/122 in setting of neurologic symptoms  Stable this am on vent  Permissive hypertension (OK if < 220/120) but gradually normalize in 5-7 days  Long-term BP goal normotensive  Hyperlipidemia  Home meds:  zetia 10  LDL ordered, goal < 70  Continue zetia at discharge based on plan of care  Consider statin based on LDL   Diabetes  HgbA1c pending, goal  < 7.0  Other Stroke Risk Factors  Obesity, Body mass index is 35.91 kg/m., recommend weight loss, diet and exercise as appropriate   Other Active Problems  Oliguria, latic up,  fluid bolus  Hospital day # 1  Rhoderick Moody [facility] See Amion for Pager information 05/12/2016 4:44 PM  I have personally examined this patient, reviewed notes, independently viewed imaging studies, participated in medical decision making and plan of care.ROS completed by me personally and pertinent positives fully documented  I have made any additions or clarifications directly to the above note. Agree with note above. She has unfortunately presented with a very large left M with  cytotoxic edema and brain herniation. I had a long discussion with patient's son  At the bedside and explained her poor prognosis. He states she would not have wanted prolonged  ventilatory support, feeding tube and nursing home  Which unavoidable. He agrees to DNR and will discuss with other family members decision on comfort care soon. This patient is critically ill and at significant risk of neurological worsening, death and care requires constant monitoring of vital signs, hemodynamics,respiratory and cardiac monitoring, extensive review of multiple databases, frequent neurological assessment, discussion with family, other specialists and medical decision making of high complexity.I have made any additions or clarifications directly to the above note.This critical care time does not reflect procedure time, or teaching time or supervisory time of PA/NP/Med Resident etc but could involve care discussion time.  I spent 40 minutes of neurocritical care time  in the care of  this patient.     Kim HeadyPramod Decorey Wahlert, MD Medical Director [facility] Pager: [phone] 05/12/2016 5:58 PM  To contact Stroke Continuity provider, please refer to [email or web address]. After hours, contact General Neurology

## 2016-05-13 ENCOUNTER — Inpatient Hospital Stay (HOSPITAL_COMMUNITY): Payer: Medicare Other

## 2016-05-13 DIAGNOSIS — I6789 Other cerebrovascular disease: Secondary | ICD-10-CM

## 2016-05-13 DIAGNOSIS — Z515 Encounter for palliative care: Secondary | ICD-10-CM

## 2016-05-13 LAB — BASIC METABOLIC PANEL
Anion gap: 3 — ABNORMAL LOW (ref 5–15)
BUN: 5 mg/dL — AB (ref 6–20)
CALCIUM: 7.5 mg/dL — AB (ref 8.9–10.3)
CHLORIDE: 125 mmol/L — AB (ref 101–111)
CO2: 27 mmol/L (ref 22–32)
CREATININE: 0.5 mg/dL (ref 0.44–1.00)
GFR calc Af Amer: >60 mL/min (ref >60)
GFR calc non Af Amer: >60 mL/min (ref >60)
GLUCOSE: 157 mg/dL — AB (ref 65–99)
Potassium: 3.8 mmol/L (ref 3.5–5.1)
Sodium: 155 mmol/L — ABNORMAL HIGH (ref 135–145)

## 2016-05-13 LAB — ECHOCARDIOGRAM COMPLETE
CHL CUP MV DEC (S): 218
CHL CUP RV SYS PRESS: 9 mmHg
EERAT: 8.72
EWDT: 218 ms
FS: 18 % — AB (ref 28–44)
Height: 64 in
IV/PV OW: 1
LA vol A4C: 26.6 ml
LA vol: 26.2 mL
LADIAMINDEX: 1.11 cm/m2
LASIZE: 24 mm
LAVOLIN: 12.1 mL/m2
LEFT ATRIUM END SYS DIAM: 24 mm
LV E/e' medial: 8.72
LV PW d: 8 mm — AB (ref 0.6–1.1)
LV SIMPSON'S DISK: 60
LV dias vol: 64 mL (ref 46–106)
LV e' LATERAL: 9.08 cm/s
LV sys vol index: 12 mL/m2
LVDIAVOLIN: 29 mL/m2
LVEEAVG: 8.72
LVOT VTI: 26.2 cm
LVOT area: 2.84 cm2
LVOT diameter: 19 mm
LVOT peak grad rest: 7 mmHg
LVOTPV: 132 cm/s
LVOTSV: 74 mL
LVSYSVOL: 25 mL (ref 14–42)
MV Peak grad: 3 mmHg
MV pk A vel: 102 m/s
MVPKEVEL: 79.2 m/s
Reg peak vel: 118 cm/s
Stroke v: 39 ml
TAPSE: 28 mm
TDI e' lateral: 9.08
TDI e' medial: 7.56
TR max vel: 118 cm/s
Weight: 3516.78 oz

## 2016-05-13 LAB — VAS US CAROTID
LCCAPSYS: 57 cm/s
LEFT ECA DIAS: -20 cm/s
LEFT VERTEBRAL DIAS: 37 cm/s
Left CCA dist dias: 7 cm/s
Left CCA dist sys: 35 cm/s
Left CCA prox dias: 10 cm/s
Left ICA dist dias: -10 cm/s
Left ICA dist sys: -66 cm/s
Left ICA prox sys: -315 cm/s
RCCADSYS: -105 cm/s
RIGHT ECA DIAS: -24 cm/s
Right CCA prox dias: 19 cm/s
Right CCA prox sys: 171 cm/s

## 2016-05-13 LAB — LIPID PANEL
Cholesterol: 205 mg/dL — ABNORMAL HIGH (ref 0–200)
HDL: 44 mg/dL (ref >40)
LDL CALC: 138 mg/dL — AB (ref 0–99)
Total CHOL/HDL Ratio: 4.7 RATIO
Triglycerides: 113 mg/dL (ref <150)
VLDL: 23 mg/dL (ref 0–40)

## 2016-05-13 LAB — BLOOD GAS, ARTERIAL
Acid-Base Excess: 0.4 mmol/L (ref 0.0–2.0)
BICARBONATE: 25.3 mmol/L (ref 20.0–28.0)
DRAWN BY: 398661
FIO2: 40
O2 SAT: 94.5 %
PATIENT TEMPERATURE: 98.6
PCO2 ART: 46.7 mmHg (ref 32.0–48.0)
PEEP: 5 cmH2O
PO2 ART: 71.8 mmHg — AB (ref 83.0–108.0)
RATE: 6 resp/min
pH, Arterial: 7.353 (ref 7.350–7.450)

## 2016-05-13 LAB — MAGNESIUM: Magnesium: 2.7 mg/dL — ABNORMAL HIGH (ref 1.7–2.4)

## 2016-05-13 LAB — CBC
HEMATOCRIT: 40.6 % (ref 36.0–46.0)
HEMOGLOBIN: 12.7 g/dL (ref 12.0–15.0)
MCH: 30.8 pg (ref 26.0–34.0)
MCHC: 31.3 g/dL (ref 30.0–36.0)
MCV: 98.3 fL (ref 78.0–100.0)
Platelets: 206 10*3/uL (ref 150–400)
RBC: 4.13 MIL/uL (ref 3.87–5.11)
RDW: 13.9 % (ref 11.5–15.5)
WBC: 14 10*3/uL — ABNORMAL HIGH (ref 4.0–10.5)

## 2016-05-13 LAB — HEMOGLOBIN A1C
Hgb A1c MFr Bld: 6.4 % — ABNORMAL HIGH (ref 4.8–5.6)
Mean Plasma Glucose: 137 mg/dL

## 2016-05-13 LAB — GLUCOSE, CAPILLARY
GLUCOSE-CAPILLARY: 143 mg/dL — AB (ref 65–99)
Glucose-Capillary: 150 mg/dL — ABNORMAL HIGH (ref 65–99)
Glucose-Capillary: 154 mg/dL — ABNORMAL HIGH (ref 65–99)
Glucose-Capillary: 155 mg/dL — ABNORMAL HIGH (ref 65–99)

## 2016-05-13 LAB — PHOSPHORUS: PHOSPHORUS: 1.9 mg/dL — AB (ref 2.5–4.6)

## 2016-05-13 LAB — SODIUM
Sodium: 157 mmol/L — ABNORMAL HIGH (ref 135–145)
Sodium: 157 mmol/L — ABNORMAL HIGH (ref 135–145)

## 2016-05-13 MED ORDER — FENTANYL BOLUS VIA INFUSION
50.0000 ug | INTRAVENOUS | Status: DC | PRN
  Filled 2016-05-13: qty 200

## 2016-05-13 MED ORDER — MIDAZOLAM BOLUS VIA INFUSION (WITHDRAWAL LIFE SUSTAINING TX)
5.0000 mg | INTRAVENOUS | Status: DC | PRN
  Filled 2016-05-13: qty 20

## 2016-05-13 MED ORDER — MIDAZOLAM HCL 5 MG/ML IJ SOLN
10.0000 mg/h | INTRAMUSCULAR | Status: DC
  Administered 2016-05-13: 10 mg/h via INTRAVENOUS
  Administered 2016-05-13: 2 mg/h via INTRAVENOUS
  Filled 2016-05-13 (×3): qty 10

## 2016-05-13 MED ORDER — SODIUM CHLORIDE 0.9 % IV SOLN
100.0000 ug/h | INTRAVENOUS | Status: DC
  Filled 2016-05-13: qty 50

## 2016-05-16 NOTE — Progress Notes (Signed)
Occupational Therapy Discharge Patient Details Name: Marylee FlorasBernice J Dunlop MRN: [id number] DOB: 1956-02-09 Today's Date: 01/12/16 Time:  -     Patient discharged from OT services secondary to medical decline - will need to re-order OT to resume therapy services and not medically ready for OT. OT to sign off acutely.Family to withdrawal care per RN staff  Please see latest therapy progress note for current level of functioning and progress toward goals.    Progress and discharge plan discussed with patient and/or caregiver: Patient unable to participate in discharge planning and no caregivers available  GO     Felecia ShellingJones, Barnaby Rippeon B   [place], Brynn   OTR/L Pager: [phone] Office: [phone] .  01/12/16, 10:02 AM

## 2016-05-16 NOTE — Progress Notes (Signed)
PULMONARY / CRITICAL CARE MEDICINE   Name: Kim Mays MRN: [id number] DOB: 1955/05/25    ADMISSION DATE:  05/02/2016 CONSULTATION DATE:  04/21/2016  REFERRING MD:  Dr. Erma HeritageIsaacs EDP  CHIEF COMPLAINT:  CVA  HISTORY OF PRESENT ILLNESS:   61 year old female with PMH as below, which is significant for COPD and her son spectulates HTN and HLD. No medical history on file. She was last known well at 12/26 at about 630PM. When her daughter went to visit her 12/27 she was unable to get into the house and after breaking in, found the patient to be down in the bathroom and unresponsive. She was rushed to ED where she was promptly intubated for airway protection. CT of the head demonstrated large L MCA distribution infarct with 646m L>R midline shift. PCCM asked to admit with neurology in consultation.  SUBJECTIVE:  No events overnight, unresponsive  VITAL SIGNS: BP (!) 155/82   Pulse (!) 101   Temp 99.1 F (37.3 C) (Axillary)   Resp 12   Ht 5\' 4"  (1.626 m)   Wt 99.7 kg (219 lb 12.8 oz)   LMP  (LMP Unknown)   SpO2 96%   BMI 37.73 kg/m   HEMODYNAMICS:    VENTILATOR SETTINGS: Vent Mode: CPAP;PSV FiO2 (%):  [40 %] 40 % Set Rate:  [6 bmp] 6 bmp PEEP:  [5 cmH20] 5 cmH20 Pressure Support:  [5 cmH20] 5 cmH20 Plateau Pressure:  [13 cmH20-20 cmH20] 16 cmH20  INTAKE / OUTPUT: I/O last 3 completed shifts: In: 4487.1 [I.V.:2737.1; IV Piggyback:1750] Out: 1889 [Urine:1889]  PHYSICAL EXAMINATION: General:  Obese female in NAD on vent Neuro:  Comatose, moves right arm to pain  HEENT:  [place]/AT, Pupils pinpoint and minimally responsive.  Cardiovascular:  RRR, no MRG Lungs:  Diminished, Clear Abdomen:  Soft, non-distended Musculoskeletal:  No acute deformity Skin:  Bruising to R shoulder  LABS:  BMET  Recent Labs Lab 05/09/2016 1817 04/29/2016 1835  05/12/16 0300  2015/12/04 0211 2015/12/04 0555 2015/12/04 0811  NA 138 140  < > 138  < > 157* 155* 157*  K 4.0 4.0  --  3.4*  --   --  3.8   --   CL 101 101  --  105  --   --  125*  --   CO2 24  --   --  24  --   --  27  --   BUN 13 15  --  13  --   --  5*  --   CREATININE 0.63 0.50  --  0.59  --   --  0.50  --   GLUCOSE 151* 160*  --  115*  --   --  157*  --   < > = values in this interval not displayed. Electrolytes  Recent Labs Lab 05/01/2016 1817 05/12/2016 2137 05/12/16 0300 2015/12/04 0555  CALCIUM 9.0  --  8.0* 7.5*  MG  --  1.9 1.9 2.7*  PHOS  --  3.9 3.1 1.9*   CBC  Recent Labs Lab 04/19/2016 1817 05/10/2016 1835 05/12/16 0300 2015/12/04 0555  WBC 13.1*  --  14.5* 14.0*  HGB 15.4* 16.3* 13.1 12.7  HCT 45.2 48.0* 40.4 40.6  PLT 249  --  215 206   Coag's  Recent Labs Lab 04/22/2016 1817  INR 1.03   Sepsis Markers  Recent Labs Lab 04/15/2016 1836 04/29/2016 2137  LATICACIDVEN 3.16* 2.7*   ABG  Recent Labs Lab 04/17/2016 1858 05/12/16 1025  05/14/2016 0415  PHART 7.327* 7.333* 7.353  PCO2ART 60.3* 46.8 46.7  PO2ART 479.0* 79.6* 71.8*   Liver Enzymes  Recent Labs Lab 07/17/15 1817  AST 38  ALT 28  ALKPHOS 67  BILITOT 0.6  ALBUMIN 3.5   Cardiac Enzymes No results for input(s): TROPONINI, PROBNP in the last 168 hours.  Glucose  Recent Labs Lab 05/12/16 1114 05/12/16 1513 05/12/16 2029 05/08/2016 0040 04/25/2016 0514 04/18/2016 0723  GLUCAP 170* 172* 110* 155* 154* 143*   Imaging Dg Chest Port 1 View  Result Date: 04/17/2016 CLINICAL DATA:  Intubated EXAM: PORTABLE CHEST 1 VIEW COMPARISON:  05/12/2016 FINDINGS: Endotracheal tube, NG tube and right central line remain in place, unchanged. Stable hyperinflation and. Heart is normal size. Minimal bibasilar atelectasis. No effusions or acute bony abnormality. IMPRESSION: Stable exam Electronically Signed   By: Charlett NoseKevin  Dover M.D.   On: 04/18/2016 08:10   STUDIES:  CT head 12/27 > 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 Faint LEFT frontal lobe petechial hemorrhage without lobar hematoma. 3 mm LEFT to  RIGHT midline shift, no ventricular entrapment.  Shoulder XR 12/27 >  CULTURES:   ANTIBIOTICS:   SIGNIFICANT EVENTS: 12/27 >  LINES/TUBES: ETT 12/27 >  ASSESSMENT / PLAN:  PULMONARY A: Inability to protect airway in setting stroke COPD without acute exacerbation  P:   Terminal extubation today O2 for comfort  CARDIOVASCULAR A:  HTN HLD  P:  D/C Tele  RENAL A:   No acute issues  P:   D/C blood draws IVF with medication drips  GASTROINTESTINAL A:   No acute issues  P:   NPO  HEMATOLOGIC A:   Leukocytosis  P:  D/C blood draws  INFECTIOUS A:   No acute issues  P:   Monitor  ENDOCRINE A:   Hyperglycemia without history of DM  P:   D/C CBGs  NEUROLOGIC A:   Acute L CVA 3mm midline shift  P:   Versed and fentanyl for comfort  FAMILY  - Updates: Spoke with son, after discussion they are ready for withdraws.  Will proceed with comfort care.  - Inter-disciplinary family meet or Palliative Care meeting due by:  1/3  The patient is critically ill with multiple organ systems failure and requires high complexity decision making for assessment and support, frequent evaluation and titration of therapies, application of advanced monitoring technologies and extensive interpretation of multiple databases.   Critical Care Time devoted to patient care services described in this note is  35  Minutes. This time reflects time of care of this signee Dr Koren BoundWesam Yacoub. This critical care time does not reflect procedure time, or teaching time or supervisory time of PA/NP/Med student/Med Resident etc but could involve care discussion time.  Alyson ReedyWesam G. Yacoub, M.D. [facility] Pulmonary/Critical Care Medicine. Pager: [phone]. After hours pager: [phone].

## 2016-05-16 NOTE — Progress Notes (Signed)
STROKE TEAM PROGRESS NOTE   HISTORY OF PRESENT ILLNESS (per record) Kim Mays is an 61 y.o. female with a history of hypertension, hyperlipidemia and COPD who was found on the floor of her home this afternoon speechless and unable to move her right side. She was last known well last night 05/10/2016 when she talked with a family member on the phone. She has no previous history of stroke nor TIA. CT scan of her head showed a large left MCA territory acute infarction, as well as hyperdense left MCA sign, likely acute M1 thrombus. Patient was intubated in the ED for airway protection and subsequently laced on propofol drip. NIH score was difficult to assess because she was completely sedated. Patient was not administered IV t-PA secondary to beyond time under for treatment consideration; large completed infarct. She was admitted to the neuro ICU for further evaluation and treatment.   SUBJECTIVE (INTERVAL HISTORY)  multiple brothers  present at the bedside.  . Her son is yet deciding on comfort care   OBJECTIVE Temp:  [98.6 F (37 C)-99.1 F (37.3 C)] 98.9 F (37.2 C) (12/29 1200) Pulse Rate:  [67-103] 82 (12/29 1200) Cardiac Rhythm: Normal sinus rhythm;Sinus bradycardia (12/29 0800) Resp:  [9-25] 25 (12/29 1200) BP: (111-155)/(61-105) 155/74 (12/29 1200) SpO2:  [93 %-98 %] 93 % (12/29 1200) FiO2 (%):  [40 %] 40 % (12/29 1200) Weight:  [219 lb 12.8 oz (99.7 kg)] 219 lb 12.8 oz (99.7 kg) (12/29 0500)  CBC:  Recent Labs Lab 2016-02-16 1817  05/12/16 0300 04/29/2016 0555  WBC 13.1*  --  14.5* 14.0*  NEUTROABS 10.9*  --   --   --   HGB 15.4*  < > 13.1 12.7  HCT 45.2  < > 40.4 40.6  MCV 91.9  --  94.0 98.3  PLT 249  --  215 206  < > = values in this interval not displayed.  Basic Metabolic Panel:  Recent Labs Lab 05/12/16 0300  05/14/2016 0555 04/21/2016 0811  NA 138  < > 155* 157*  K 3.4*  --  3.8  --   CL 105  --  125*  --   CO2 24  --  27  --   GLUCOSE 115*  --  157*  --    BUN 13  --  5*  --   CREATININE 0.59  --  0.50  --   CALCIUM 8.0*  --  7.5*  --   MG 1.9  --  2.7*  --   PHOS 3.1  --  1.9*  --   < > = values in this interval not displayed.  Lipid Panel:     Component Value Date/Time   CHOL 205 (H) 05/15/2016 0555   TRIG 113 04/17/2016 0555   HDL 44 04/15/2016 0555   CHOLHDL 4.7 05/09/2016 0555   VLDL 23 04/29/2016 0555   LDLCALC 138 (H) 05/14/2016 0555   HgbA1c: No results found for: HGBA1C Urine Drug Screen:     Component Value Date/Time   LABOPIA NONE DETECTED 02/14/2016 2331   COCAINSCRNUR NONE DETECTED 02/14/2016 2331   LABBENZ NONE DETECTED 02/14/2016 2331   AMPHETMU NONE DETECTED 02/14/2016 2331   THCU NONE DETECTED 02/14/2016 2331   LABBARB NONE DETECTED 02/14/2016 2331      IMAGING  Ct Head Wo Contrast 02/14/2016 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 Faint LEFT frontal lobe petechial hemorrhage without lobar hematoma. 3 mm LEFT to RIGHT midline  shift, no ventricular entrapment.   Dg Chest Port 1 View 05/12/2016 1. Stable support apparatus. 2. Emphysema. 3. Minimal bibasilar airspace disease likely reflects atelectasis.  31-May-2016 Tip of the right central line in the mid SVC.  No pneumothorax. Unchanged bibasilar atelectasis.  05/31/2016 Endotracheal tube 5 cm above the carina. Bibasilar atelectasis.   Dg Shoulder Right Port 31-May-2016 No evidence of fracture or dislocation.   Dg Abd Portable 1v 05/12/2016 Orogastric tube tip and side port in stomach. Bowel gas pattern unremarkable.    PHYSICAL EXAM Middle-aged obese Caucasian lady who is sedated and  intubated  She has a right internal jugular central IV line. . Afebrile. Head is nontraumatic. Neck is supple without bruit.    Cardiac exam no murmur or gallop. Lungs are clear to auscultation. Distal pulses are well felt. Neurologic  Exam : She is sedated and intubated. Comatose. Right gaze deviation. Left gaze  palsy. Pupils 3 mm sluggishly reactive. Slight  Eye is with hypertropia of the right eye. Fundi were not visualized. Dense right hemiplegia with hypo-tonia. Withdraws left upper and lower extremity to pain. Right plantar upgoing left downgoing. ASSESSMENT/PLAN Kim Mays is a 61 y.o. female with history of Hypertension, hyperlipidemia and COPD found down with right hemiparesis which progressed to respiratory failure. She did not receive IV t-PA due to delay in arrival.   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 Infarct embolic secondary to unknown source  Resultant  vent dependent respiratory failure, right hemiparesis, global aphasia, dysphagia  MRI  ordered  MRA  ordered  Carotid Doppler  ordered  2D Echo  ordered  LDL ordered  HgbA1c ordered  SCDs for VTE prophylaxis    No antithrombotic prior to admission, now on No antithrombotic. Add aspirin per tube  Ongoing aggressive stroke risk factor management  Therapy recommendations:  pending   Disposition:  pending   Stroke is massive, not compatible with positive neuro prognosis. Will aggressively treat for now and Dr. Pearlean Brownie to meet with family   Acute respiratory Failure  Secondary to large stroke  Intubated in ED  Sedated on vent  CCM on board  Cytotoxic cerebral edema / induced hypernatremia  3 mm left to right shift  NA on admission 139, up to 143 this am.  On 3% normal saline at 50 mls an hour  Give 23.4% saline injection, now  Increase 3% saline to 70 mls an hour  Check Na q6h  Hypertensive Emergency  Blood pressure as high as 228/122 in setting of neurologic symptoms  Stable this am on vent  Permissive hypertension (OK if < 220/120) but gradually normalize in 5-7 days  Long-term BP goal normotensive  Hyperlipidemia  Home meds:  zetia 10  LDL ordered, goal <  70  Continue zetia at discharge based on plan of care  Consider statin based on LDL   Diabetes  HgbA1c pending, goal < 7.0  Other Stroke Risk Factors  Obesity, Body mass index is 37.73 kg/m., recommend weight loss, diet and exercise as appropriate   Other Active Problems  Oliguria, latic up,  fluid bolus  Hospital day # 2  Savas Elvin  [facility] See Amion for Pager information 04/16/2016 12:38 PM  I have personally examined this patient, reviewed notes, independently viewed imaging studies, participated in medical decision making and plan of care.ROS completed by me personally and pertinent positives  fully documented  I have made any additions or clarifications directly to the above note. Agree with note above. She has unfortunately presented with a very large left M with cytotoxic edema and brain herniation. I had a long discussion with patient's brothers at the bedside and explained her poor prognosis. He states she would not have wanted prolonged  ventilatory support, feeding tube and nursing home  Which is  unavoidable. He agrees to DNR and will discuss with other family members decision on comfort care soon. This patient is critically ill and at significant risk of neurological worsening, death and care requires constant monitoring of vital signs, hemodynamics,respiratory and cardiac monitoring, extensive review of multiple databases, frequent neurological assessment, discussion with family, other specialists and medical decision making of high complexity.I have made any additions or clarifications directly to the above note.This critical care time does not reflect procedure time, or teaching time or supervisory time of PA/NP/Med Resident etc but could involve care discussion time.  I spent 35 minutes of neurocritical care time  in the care of  this patient.     Delia HeadyPramod Ramona Slinger, MD Medical Director [facility] Pager: [phone] 08/10/2015 12:38  PM  To contact Stroke Continuity provider, please refer to [email or web address]. After hours, contact General Neurology

## 2016-05-16 NOTE — Progress Notes (Signed)
PT Cancellation Note and D/C note  Patient Details Name: Kim Mays MRN: [id number] DOB: April 04, 1956   Cancelled Treatment:    Reason Eval/Treat Not Completed: Medical issues which prohibited therapy (Sign off due to withdrawal of care.  Thanks. )   Amadeo GarnetDawn F Robin Pafford 05/02/2016, 9:54 AM  Eber Jonesawn Dayyan Krist,PT Acute Rehabilitation [phone] (pager)

## 2016-05-16 NOTE — Progress Notes (Signed)
Na: 157. 3% stopped per order. Dr. Roseanne RenoStewart notified via text page. Will continue to monitor.

## 2016-05-16 NOTE — Progress Notes (Signed)
Echocardiogram 2D Echocardiogram has been performed.  Marisue Humblelexis N Taneal Sonntag 04/19/2016, 11:49 AM

## 2016-05-16 NOTE — Progress Notes (Signed)
Pt extubated using withdrawal guidelines.  RN at bedside.  

## 2016-05-16 NOTE — Progress Notes (Signed)
   Met w/ family and patient at bedside.  Per family request, prayed at bedside.  Will follow, as needed.   - Rev. [place] MDiv ThM

## 2016-05-16 DEATH — deceased

## 2016-05-19 ENCOUNTER — Telehealth: Payer: Self-pay

## 2016-05-19 NOTE — Telephone Encounter (Signed)
On 05/19/16 I received a death certificate from [facility] (original). The death certificate is for burial. The patient is a patient of Doctor Molli KnockYacoub. The death certificate will be taken to Pulmonary Unit @ [facility] this pm to see if Doctor Craige CottaSood can sign the death certificate since Doctor Molli KnockYacoub is on vacation this week.  On 05/20/16 I received the death certificate back from Doctor OnawaySood. I got the death certificate ready and called Dennis [facility]ce [place] [facility] and she is going to come and pick it up.

## 2016-05-31 NOTE — Discharge Summary (Signed)
Kim Mays, Kim Mays             ACCOUNT NO.:  [id number]  MEDICAL RECORD NO.:  [id number]  LOCATION:  3M01C                        FACILITY:  [facility]  PHYSICIAN:  Felipa EvenerWesam Jake Yacoub, MD  DATE OF BIRTH:  1956-02-22  DATE OF ADMISSION:  05/07/2016 DATE OF DISCHARGE:  05/14/2016                              DISCHARGE SUMMARY   PRIMARY DIAGNOSIS/CAUSE OF DEATH:  Acute left-sided middle cerebral artery stroke.  SECONDARY DIAGNOSES:  Acute respiratory failure, chronic obstructive pulmonary disease without acute exacerbation, hypertension, hyperlipidemia, leukocytosis, hyperglycemia.  The patient is a 61 year old female, past medical history significant for hypertension, hyperlipidemia, presented to the hospital after being found unresponsive and brought to emergency department, where she was noted to be unable to protect her airway.  The patient was intubated [facility]  Head CT was performed that showed large left-sided middle cerebral artery infarct with significant midline shift.  The patient was admitted to the ICU and was seen by Neurology, who informed the family that the patient has a poor prognosis because of the size of the stroke and no meaningful chance of any significant recovery, at which point, the family decided the patient would not want tracheostomy feeding tube and would not want that level of care, at which point, the patient was started on morphine and was extubated, expired shortly thereafter with family at bedside.     Felipa EvenerWesam Jake Yacoub, MD     WJY/MEDQ  D:  05/30/2016  T:  05/30/2016  Job:  [id number]

## 2018-08-09 IMAGING — CR DG CHEST 1V PORT
1 series · 1 of 1 positions shown · non-contrast
Comparison: Radiograph earlier this day at 4711 hour

CLINICAL DATA: Encounter for central line placement.

EXAM:
PORTABLE CHEST 1 VIEW

[AP]
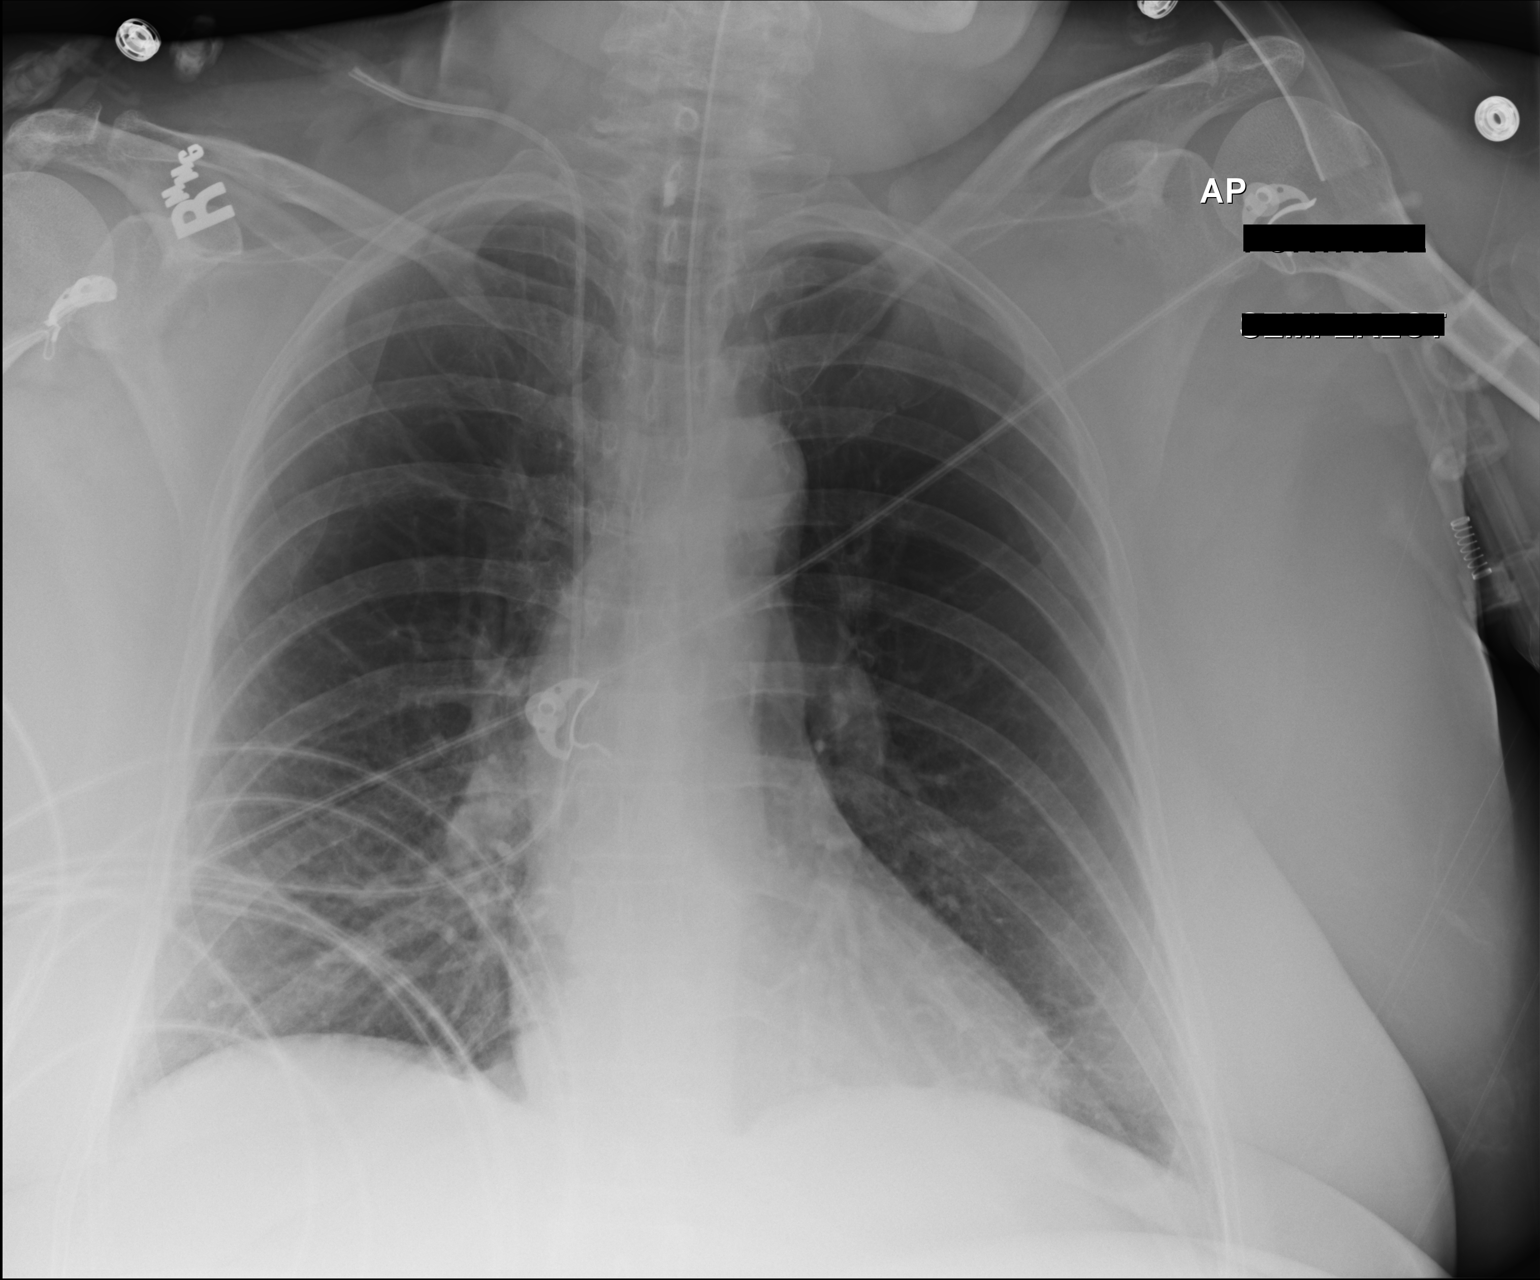

[1 of 1 positions shown; findings below may reference images not displayed]

FINDINGS: Tip of the right internal jugular central venous catheter projects
over the mid SVC. No pneumothorax. Endotracheal tube 4.9 cm from the
carina. Unchanged heart size and mediastinal contours. Mild
bibasilar atelectasis again seen. No pulmonary edema.
IMPRESSION: Tip of the right central line in the mid SVC.  No pneumothorax.

Unchanged bibasilar atelectasis.

## 2018-08-10 IMAGING — DX DG ABD PORTABLE 1V
1 series · 1 of 1 positions shown · non-contrast
Comparison: CT abdomen and pelvis May 05, 2016

CLINICAL DATA: Orogastric tube placement

EXAM:
PORTABLE ABDOMEN - 1 VIEW

[abdomen supine]
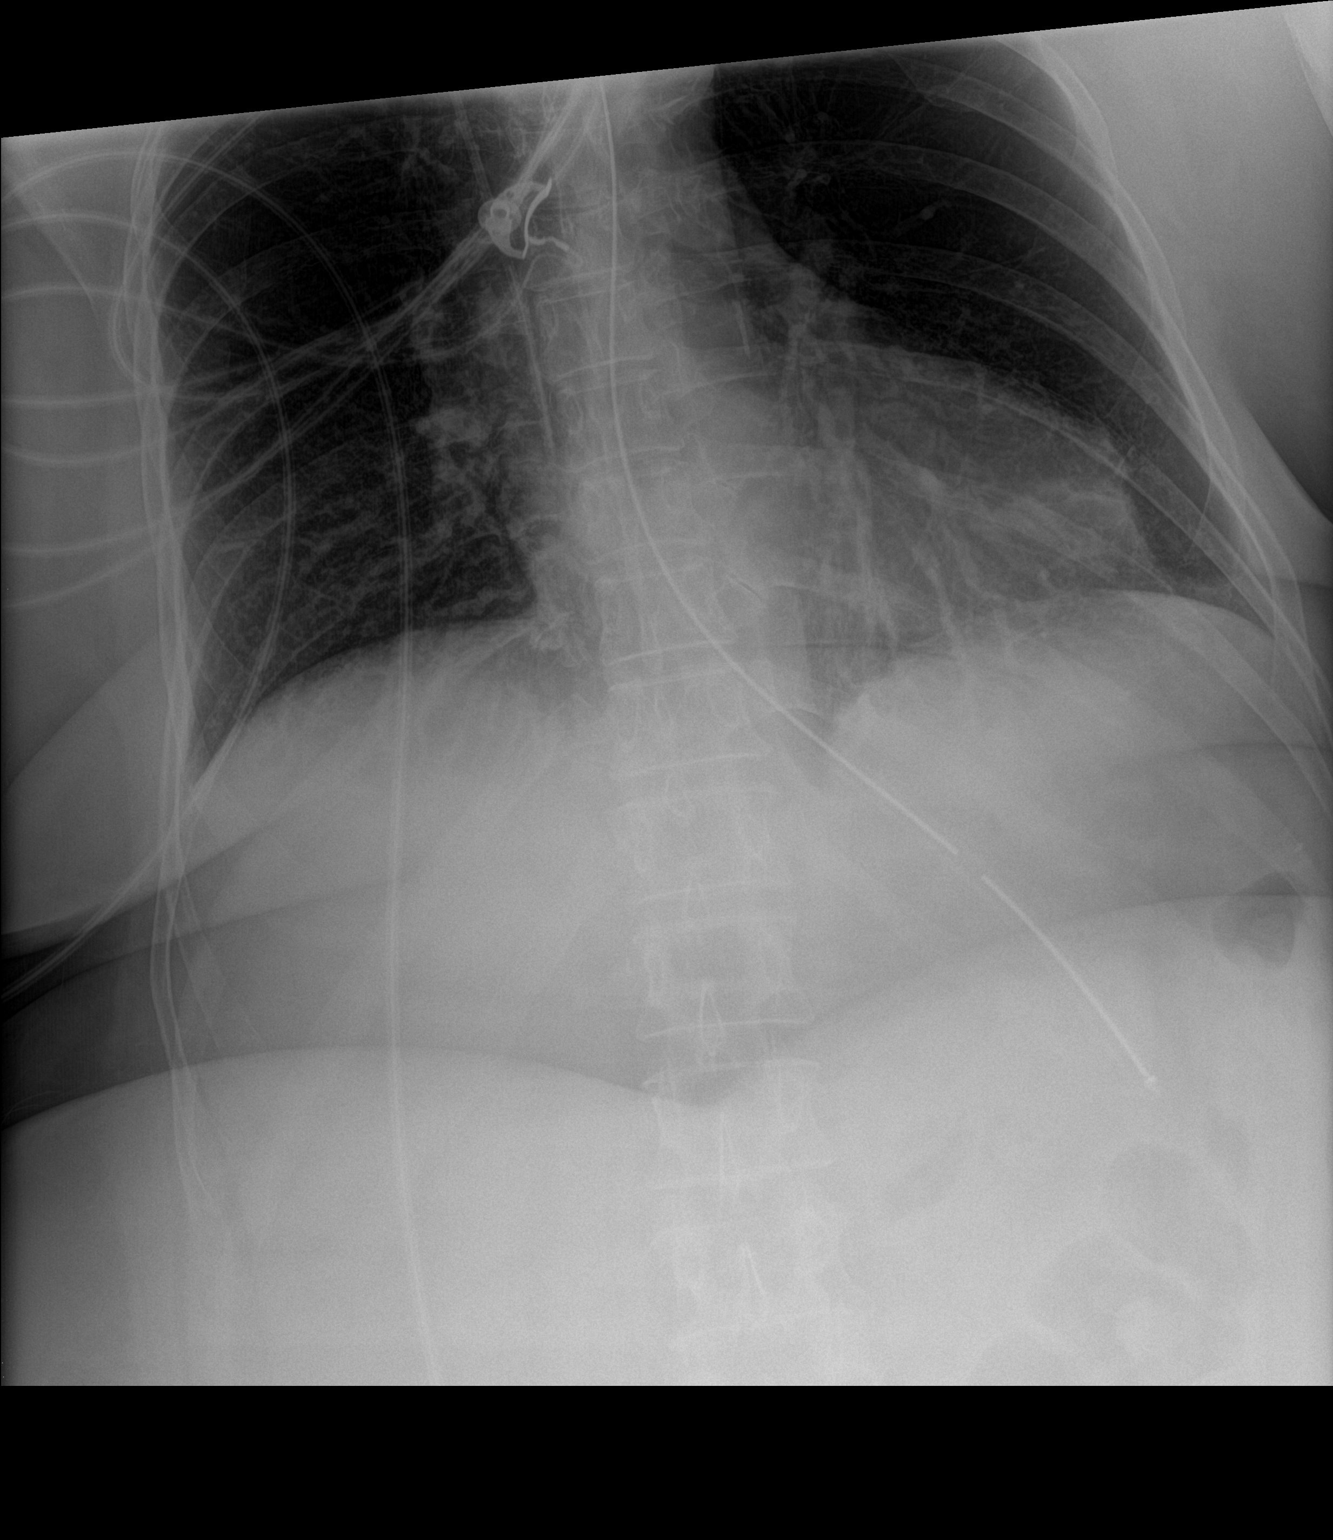

[1 of 1 positions shown; findings below may reference images not displayed]

FINDINGS: Orogastric tube tip and side port are in the stomach. Visualized
bowel gas pattern is unremarkable. No obstruction or free air
evident. Visualized lung bases are clear.
IMPRESSION: Orogastric tube tip and side port in stomach. Bowel gas pattern
unremarkable.
